# Patient Record
Sex: Male | Born: 1945 | Race: White | Hispanic: No | Smoking: Former smoker
Health system: Southern US, Community
[De-identification: ages and names within clinical notes are randomized; demographics above are authoritative.]

---

## 2003-12-10 ENCOUNTER — Encounter (INDEPENDENT_AMBULATORY_CARE_PROVIDER_SITE_OTHER): Payer: Self-pay | Admitting: Specialist

## 2003-12-10 ENCOUNTER — Ambulatory Visit (HOSPITAL_COMMUNITY): Admission: RE | Admit: 2003-12-10 | Discharge: 2003-12-10 | Payer: Self-pay | Admitting: Gastroenterology

## 2005-08-11 ENCOUNTER — Encounter: Admission: RE | Admit: 2005-08-11 | Discharge: 2005-08-11 | Payer: Self-pay | Admitting: Family Medicine

## 2005-08-21 ENCOUNTER — Encounter: Admission: RE | Admit: 2005-08-21 | Discharge: 2005-08-21 | Payer: Self-pay | Admitting: Family Medicine

## 2005-08-29 ENCOUNTER — Encounter: Admission: RE | Admit: 2005-08-29 | Discharge: 2005-08-29 | Payer: Self-pay | Admitting: Family Medicine

## 2006-01-05 ENCOUNTER — Encounter: Admission: RE | Admit: 2006-01-05 | Discharge: 2006-01-05 | Payer: Self-pay | Admitting: Family Medicine

## 2006-12-25 ENCOUNTER — Encounter: Admission: RE | Admit: 2006-12-25 | Discharge: 2006-12-25 | Payer: Self-pay | Admitting: Family Medicine

## 2008-09-02 IMAGING — CR DG RIBS W/ CHEST 3+V*R*
4 series · 4 of 4 positions shown · non-contrast
Comparison: none

CLINICAL DATA: Abnormal ribs on bone scan.

RIGHT RIBS - 4 VIEW total, including a single view of the chest

[w chest pa]
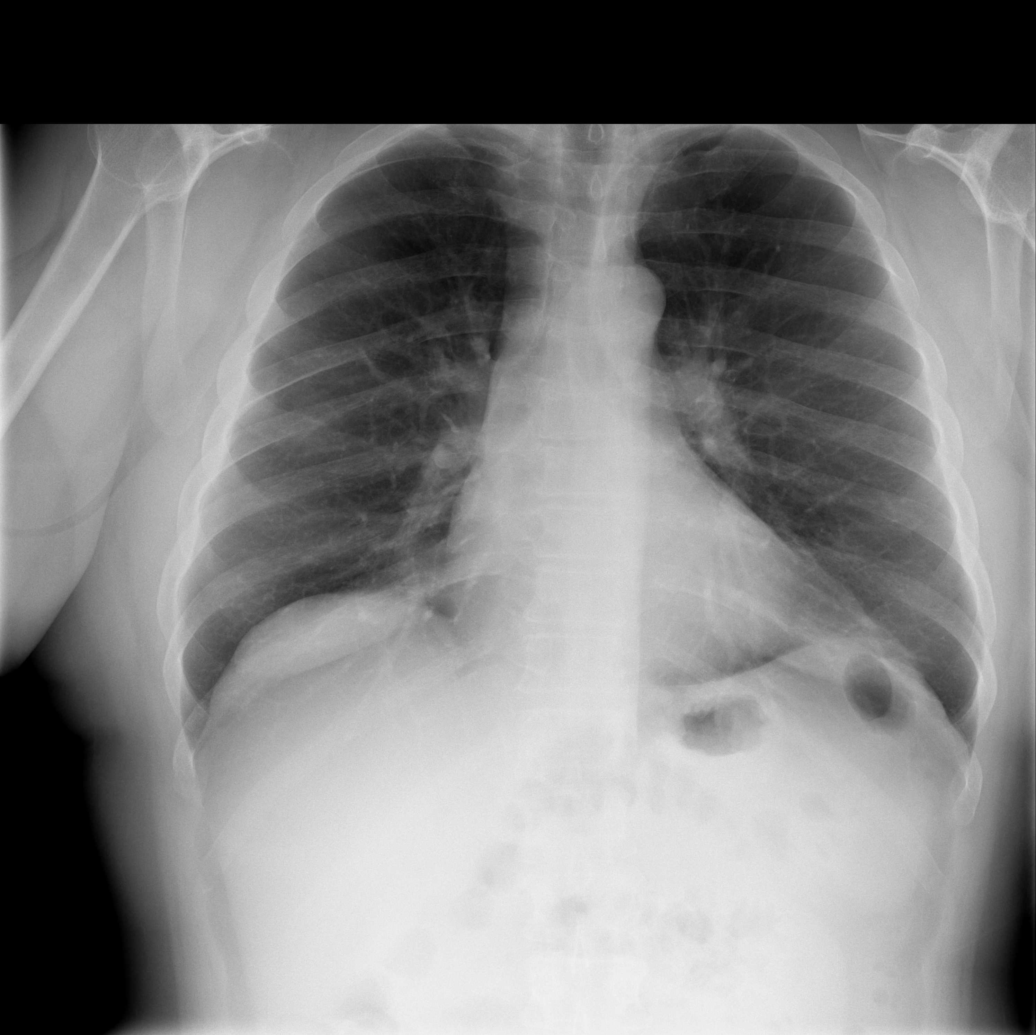

[t ribs ap/pa  lower right]
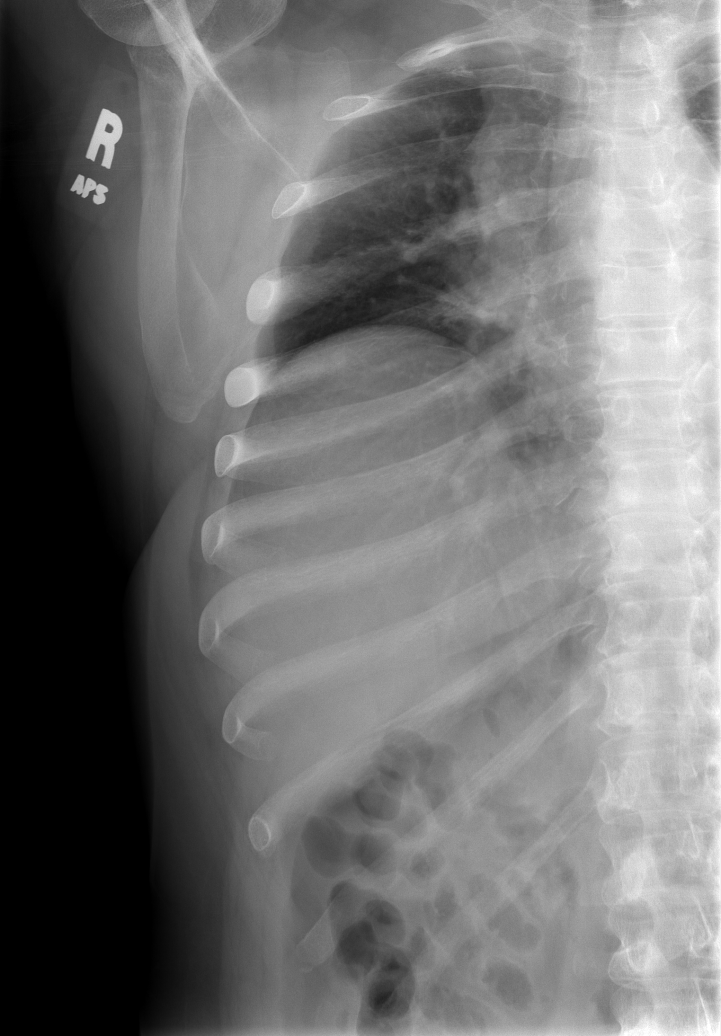

[t ribs obl. right (1 of 2)]
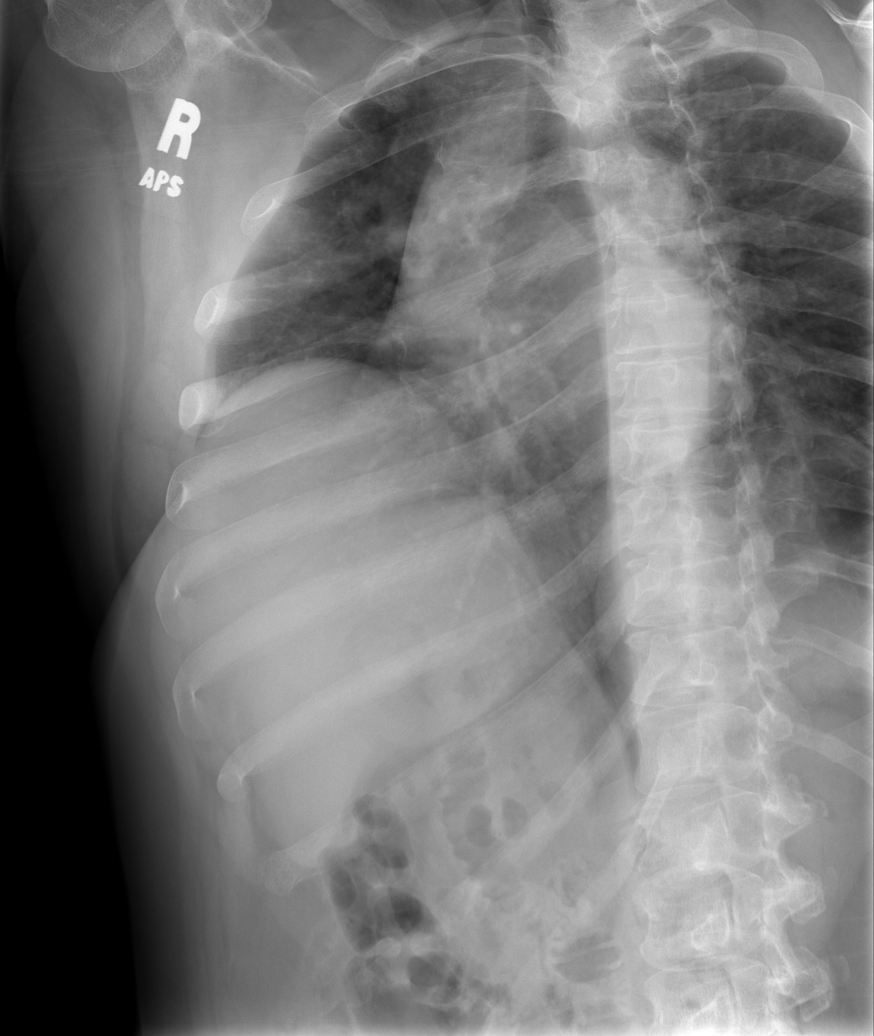

[t ribs obl. right (2 of 2)]
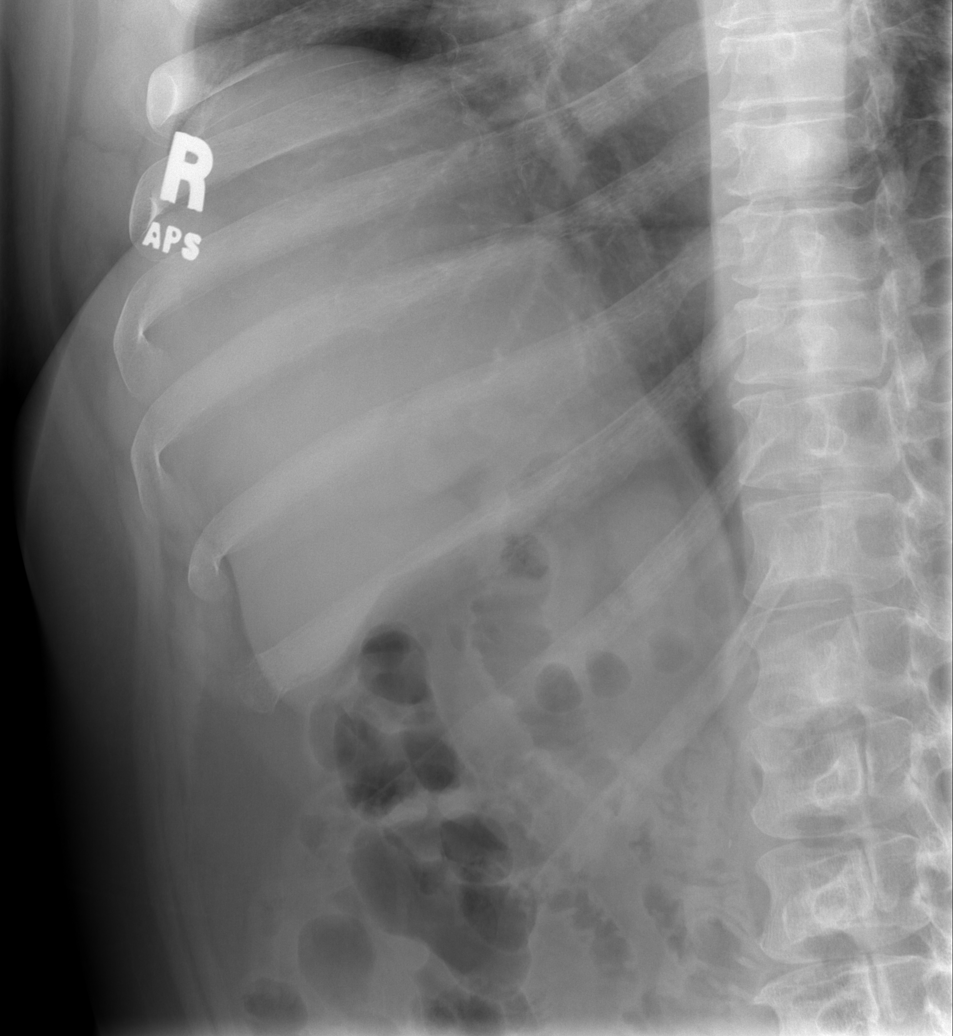

[4 of 4 positions shown; findings below may reference images not displayed]

FINDINGS: This faint sclerosis and expansion of the right fourth and fifth
ribs. Conventional radiography does not depict the involvement of the 8th 10th
and 12th ribs which is known to be present based on the prior bone scan. On
prior CT, these regions appear somewhat expansile and with groundglass matrix.

The lungs appear clear. The heart and mediastinum appear unremarkable.

IMPRESSION

1. Expansion and sclerosis in the lateral portions of the right fourth and fifth
ribs. Correlating with prior CT scan and bone scan, the appearance is suspicious
for fibrous dysplasia of the ribs as the underlying cause, given the groundglass
matrix and mildly expansile appearance on CT. This would be an unusual way for
metastatic recurrent testicular cancer to present.  Metastatic prostate cancer
can present with sclerotic bony lesions, and correlation with prostate physical
exam and PSA level is suggested. Followup imaging of these ribs may be warranted
to ensure stability.

## 2010-09-09 NOTE — Op Note (Signed)
NAME:  Timothy Johnson, Timothy Johnson                       ACCOUNT NO.:  [id number]   MEDICAL RECORD NO.:  [id number]                   PATIENT TYPE:  AMB   LOCATION:  ENDO                                 FACILITY:  [facility]   PHYSICIAN:  Graylin Shiver, M.D.                DATE OF BIRTH:  [birth year]   DATE OF PROCEDURE:  12/10/2003  DATE OF DISCHARGE:                                 OPERATIVE REPORT   PROCEDURE:  Colonoscopy with biopsy.   INDICATIONS FOR PROCEDURE:  Screening.  Informed consent was obtained after  explanation of the risks of bleeding, infection, and perforation.   PREMEDICATION:  Fentanyl 60 mcg IV, Versed 6 mg IV.   PROCEDURE IN DETAIL:  With the patient in the left lateral decubitus  position, a rectal exam was performed and no masses were felt.  The Olympus  colonoscope was inserted into the rectum and advanced around the colon to  the cecum.  Cecal landmarks were identified.  In the cecum, there was a  small 3 mm polyp biopsied with cold forceps.  The ascending colon looked  normal.  The transverse colon looked normal.  The descending colon and  sigmoid looked normal.  In the rectum, there was a small 3 mm polyp biopsied  with cold forceps.  He tolerated the procedure well without complications.   IMPRESSION:  1. Cecal polyp, code 211.3.  2. Rectal polyp, code 211.4.   PLAN:  The pathology will be checked.                                               Graylin Shiver, M.D.    [name]  D:  12/10/2003  T:  12/10/2003  Job:  [id number]

## 2014-05-20 ENCOUNTER — Other Ambulatory Visit: Payer: Self-pay | Admitting: Family Medicine

## 2014-05-20 DIAGNOSIS — R319 Hematuria, unspecified: Secondary | ICD-10-CM

## 2014-05-21 ENCOUNTER — Ambulatory Visit
Admission: RE | Admit: 2014-05-21 | Discharge: 2014-05-21 | Disposition: A | Discharge status: Home / Self Care | Payer: Medicare Other | Source: Ambulatory Visit | Attending: Family Medicine | Admitting: Family Medicine

## 2014-05-21 DIAGNOSIS — R319 Hematuria, unspecified: Secondary | ICD-10-CM

## 2015-05-31 DIAGNOSIS — R05 Cough: Secondary | ICD-10-CM | POA: Diagnosis not present

## 2015-05-31 DIAGNOSIS — J069 Acute upper respiratory infection, unspecified: Secondary | ICD-10-CM | POA: Diagnosis not present

## 2015-07-19 DIAGNOSIS — N400 Enlarged prostate without lower urinary tract symptoms: Secondary | ICD-10-CM | POA: Diagnosis not present

## 2015-07-19 DIAGNOSIS — R351 Nocturia: Secondary | ICD-10-CM | POA: Diagnosis not present

## 2015-07-19 DIAGNOSIS — Z0000 Encounter for general adult medical examination without abnormal findings: Secondary | ICD-10-CM | POA: Diagnosis not present

## 2015-09-21 DIAGNOSIS — F411 Generalized anxiety disorder: Secondary | ICD-10-CM | POA: Diagnosis not present

## 2015-09-21 DIAGNOSIS — E7800 Pure hypercholesterolemia, unspecified: Secondary | ICD-10-CM | POA: Diagnosis not present

## 2015-09-21 DIAGNOSIS — Z125 Encounter for screening for malignant neoplasm of prostate: Secondary | ICD-10-CM | POA: Diagnosis not present

## 2015-09-21 DIAGNOSIS — I10 Essential (primary) hypertension: Secondary | ICD-10-CM | POA: Diagnosis not present

## 2015-09-21 DIAGNOSIS — Z1389 Encounter for screening for other disorder: Secondary | ICD-10-CM | POA: Diagnosis not present

## 2015-09-21 DIAGNOSIS — Z0000 Encounter for general adult medical examination without abnormal findings: Secondary | ICD-10-CM | POA: Diagnosis not present

## 2015-09-21 DIAGNOSIS — N400 Enlarged prostate without lower urinary tract symptoms: Secondary | ICD-10-CM | POA: Diagnosis not present

## 2015-09-21 DIAGNOSIS — J45909 Unspecified asthma, uncomplicated: Secondary | ICD-10-CM | POA: Diagnosis not present

## 2015-09-21 DIAGNOSIS — Z23 Encounter for immunization: Secondary | ICD-10-CM | POA: Diagnosis not present

## 2016-03-23 DIAGNOSIS — N400 Enlarged prostate without lower urinary tract symptoms: Secondary | ICD-10-CM | POA: Diagnosis not present

## 2016-03-23 DIAGNOSIS — J45909 Unspecified asthma, uncomplicated: Secondary | ICD-10-CM | POA: Diagnosis not present

## 2016-03-23 DIAGNOSIS — F411 Generalized anxiety disorder: Secondary | ICD-10-CM | POA: Diagnosis not present

## 2016-03-23 DIAGNOSIS — E7800 Pure hypercholesterolemia, unspecified: Secondary | ICD-10-CM | POA: Diagnosis not present

## 2016-03-23 DIAGNOSIS — I10 Essential (primary) hypertension: Secondary | ICD-10-CM | POA: Diagnosis not present

## 2016-07-11 DIAGNOSIS — I10 Essential (primary) hypertension: Secondary | ICD-10-CM | POA: Diagnosis not present

## 2016-09-29 DIAGNOSIS — Z0000 Encounter for general adult medical examination without abnormal findings: Secondary | ICD-10-CM | POA: Diagnosis not present

## 2016-09-29 DIAGNOSIS — I10 Essential (primary) hypertension: Secondary | ICD-10-CM | POA: Diagnosis not present

## 2016-09-29 DIAGNOSIS — E7800 Pure hypercholesterolemia, unspecified: Secondary | ICD-10-CM | POA: Diagnosis not present

## 2016-09-29 DIAGNOSIS — Z1389 Encounter for screening for other disorder: Secondary | ICD-10-CM | POA: Diagnosis not present

## 2017-04-06 DIAGNOSIS — E7800 Pure hypercholesterolemia, unspecified: Secondary | ICD-10-CM | POA: Diagnosis not present

## 2017-04-06 DIAGNOSIS — F411 Generalized anxiety disorder: Secondary | ICD-10-CM | POA: Diagnosis not present

## 2017-04-06 DIAGNOSIS — I10 Essential (primary) hypertension: Secondary | ICD-10-CM | POA: Diagnosis not present

## 2017-04-06 DIAGNOSIS — N400 Enlarged prostate without lower urinary tract symptoms: Secondary | ICD-10-CM | POA: Diagnosis not present

## 2017-06-11 DIAGNOSIS — J45901 Unspecified asthma with (acute) exacerbation: Secondary | ICD-10-CM | POA: Diagnosis not present

## 2017-07-09 DIAGNOSIS — S0500XA Injury of conjunctiva and corneal abrasion without foreign body, unspecified eye, initial encounter: Secondary | ICD-10-CM | POA: Diagnosis not present

## 2017-07-10 DIAGNOSIS — H1031 Unspecified acute conjunctivitis, right eye: Secondary | ICD-10-CM | POA: Diagnosis not present

## 2017-10-01 ENCOUNTER — Ambulatory Visit
Admission: RE | Admit: 2017-10-01 | Discharge: 2017-10-01 | Disposition: A | Discharge status: Home / Self Care | Payer: Medicare Other | Source: Ambulatory Visit | Attending: Family Medicine | Admitting: Family Medicine

## 2017-10-01 ENCOUNTER — Other Ambulatory Visit: Payer: Self-pay | Admitting: Family Medicine

## 2017-10-01 DIAGNOSIS — I10 Essential (primary) hypertension: Secondary | ICD-10-CM | POA: Diagnosis not present

## 2017-10-01 DIAGNOSIS — R05 Cough: Secondary | ICD-10-CM

## 2017-10-01 DIAGNOSIS — R059 Cough, unspecified: Secondary | ICD-10-CM

## 2017-10-01 DIAGNOSIS — E7800 Pure hypercholesterolemia, unspecified: Secondary | ICD-10-CM | POA: Diagnosis not present

## 2017-10-01 DIAGNOSIS — Z0000 Encounter for general adult medical examination without abnormal findings: Secondary | ICD-10-CM | POA: Diagnosis not present

## 2017-10-01 DIAGNOSIS — N400 Enlarged prostate without lower urinary tract symptoms: Secondary | ICD-10-CM | POA: Diagnosis not present

## 2018-03-20 ENCOUNTER — Observation Stay (HOSPITAL_COMMUNITY)
Admission: EM | Admit: 2018-03-20 | Discharge: 2018-03-21 | Disposition: A | Discharge status: Home / Self Care | Payer: Medicare Other | Attending: Internal Medicine | Admitting: Internal Medicine

## 2018-03-20 ENCOUNTER — Encounter (HOSPITAL_COMMUNITY): Payer: Self-pay | Admitting: Emergency Medicine

## 2018-03-20 ENCOUNTER — Emergency Department (HOSPITAL_COMMUNITY): Payer: Medicare Other

## 2018-03-20 ENCOUNTER — Other Ambulatory Visit: Payer: Self-pay

## 2018-03-20 DIAGNOSIS — R42 Dizziness and giddiness: Secondary | ICD-10-CM | POA: Diagnosis not present

## 2018-03-20 DIAGNOSIS — R9431 Abnormal electrocardiogram [ECG] [EKG]: Secondary | ICD-10-CM | POA: Diagnosis not present

## 2018-03-20 DIAGNOSIS — R112 Nausea with vomiting, unspecified: Secondary | ICD-10-CM | POA: Diagnosis present

## 2018-03-20 DIAGNOSIS — E860 Dehydration: Secondary | ICD-10-CM | POA: Diagnosis present

## 2018-03-20 DIAGNOSIS — F419 Anxiety disorder, unspecified: Secondary | ICD-10-CM | POA: Insufficient documentation

## 2018-03-20 DIAGNOSIS — Z7951 Long term (current) use of inhaled steroids: Secondary | ICD-10-CM | POA: Diagnosis not present

## 2018-03-20 DIAGNOSIS — Z79899 Other long term (current) drug therapy: Secondary | ICD-10-CM | POA: Insufficient documentation

## 2018-03-20 DIAGNOSIS — I10 Essential (primary) hypertension: Secondary | ICD-10-CM | POA: Diagnosis not present

## 2018-03-20 DIAGNOSIS — I4581 Long QT syndrome: Secondary | ICD-10-CM | POA: Diagnosis not present

## 2018-03-20 LAB — COMPREHENSIVE METABOLIC PANEL
ALK PHOS: 61 U/L (ref 38–126)
ALT: 16 U/L (ref 0–44)
AST: 19 U/L (ref 15–41)
Albumin: 3.5 g/dL (ref 3.5–5.0)
Anion gap: 9 (ref 5–15)
BUN: 16 mg/dL (ref 8–23)
CALCIUM: 8.7 mg/dL — AB (ref 8.9–10.3)
CHLORIDE: 98 mmol/L (ref 98–111)
CO2: 27 mmol/L (ref 22–32)
CREATININE: 0.87 mg/dL (ref 0.61–1.24)
GFR calc non Af Amer: >60 mL/min (ref >60)
Glucose, Bld: 152 mg/dL — ABNORMAL HIGH (ref 70–99)
Potassium: 4.1 mmol/L (ref 3.5–5.1)
SODIUM: 134 mmol/L — AB (ref 135–145)
Total Bilirubin: 0.6 mg/dL (ref 0.3–1.2)
Total Protein: 6.6 g/dL (ref 6.5–8.1)

## 2018-03-20 LAB — CBC WITH DIFFERENTIAL/PLATELET
Abs Immature Granulocytes: 0.08 10*3/uL — ABNORMAL HIGH (ref 0.00–0.07)
BASOS ABS: 0.2 10*3/uL — AB (ref 0.0–0.1)
BASOS PCT: 2 %
EOS ABS: 0.2 10*3/uL (ref 0.0–0.5)
EOS PCT: 2 %
HCT: 43.9 % (ref 39.0–52.0)
HEMOGLOBIN: 14.1 g/dL (ref 13.0–17.0)
Immature Granulocytes: 1 %
Lymphocytes Relative: 11 %
Lymphs Abs: 1 10*3/uL (ref 0.7–4.0)
MCH: 29.1 pg (ref 26.0–34.0)
MCHC: 32.1 g/dL (ref 30.0–36.0)
MCV: 90.5 fL (ref 80.0–100.0)
Monocytes Absolute: 0.5 10*3/uL (ref 0.1–1.0)
Monocytes Relative: 5 %
NRBC: 0 % (ref 0.0–0.2)
Neutro Abs: 7.6 10*3/uL (ref 1.7–7.7)
Neutrophils Relative %: 79 %
Platelets: 295 10*3/uL (ref 150–400)
RBC: 4.85 MIL/uL (ref 4.22–5.81)
RDW: 12.6 % (ref 11.5–15.5)
WBC: 9.6 10*3/uL (ref 4.0–10.5)

## 2018-03-20 LAB — MAGNESIUM: Magnesium: 2 mg/dL (ref 1.7–2.4)

## 2018-03-20 LAB — I-STAT TROPONIN, ED: Troponin i, poc: 0 ng/mL (ref 0.00–0.08)

## 2018-03-20 MED ORDER — MECLIZINE HCL 12.5 MG PO TABS
12.5000 mg | ORAL_TABLET | Freq: Two times a day (BID) | ORAL | Status: DC | PRN
  Filled 2018-03-20: qty 1

## 2018-03-20 MED ORDER — MECLIZINE HCL 12.5 MG PO TABS: 12.5000 mg | ORAL_TABLET | Freq: Two times a day (BID) | ORAL | 0 refills | Status: DC | PRN

## 2018-03-20 MED ORDER — SODIUM CHLORIDE 0.9 % IV BOLUS
500.0000 mL | Freq: Once | INTRAVENOUS | Status: AC
  Administered 2018-03-20: 500 mL via INTRAVENOUS

## 2018-03-20 MED ORDER — HYDROCODONE-ACETAMINOPHEN 5-325 MG PO TABS
1.0000 | ORAL_TABLET | ORAL | Status: DC | PRN
  Administered 2018-03-21: 1 via ORAL
  Filled 2018-03-20: qty 1

## 2018-03-20 MED ORDER — LORAZEPAM 2 MG/ML IJ SOLN
0.5000 mg | Freq: Once | INTRAMUSCULAR | Status: AC
  Administered 2018-03-20: 0.5 mg via INTRAVENOUS
  Filled 2018-03-20: qty 1

## 2018-03-20 MED ORDER — LORAZEPAM 0.5 MG PO TABS: 0.5000 mg | ORAL_TABLET | Freq: Two times a day (BID) | ORAL | 0 refills | Status: DC | PRN

## 2018-03-20 MED ORDER — ACETAMINOPHEN 650 MG RE SUPP: 650.0000 mg | Freq: Four times a day (QID) | RECTAL | Status: DC | PRN

## 2018-03-20 MED ORDER — ACETAMINOPHEN 325 MG PO TABS
650.0000 mg | ORAL_TABLET | Freq: Four times a day (QID) | ORAL | Status: DC | PRN
  Administered 2018-03-21: 650 mg via ORAL
  Filled 2018-03-20: qty 2

## 2018-03-20 MED ORDER — SODIUM CHLORIDE 0.9 % IV SOLN
INTRAVENOUS | Status: AC
  Administered 2018-03-20: 22:00:00 via INTRAVENOUS

## 2018-03-20 MED ORDER — LOSARTAN POTASSIUM 50 MG PO TABS
100.0000 mg | ORAL_TABLET | Freq: Every day | ORAL | Status: DC
  Administered 2018-03-21: 100 mg via ORAL
  Filled 2018-03-20: qty 2

## 2018-03-20 MED ORDER — LORAZEPAM 2 MG/ML IJ SOLN: 0.5000 mg | Freq: Four times a day (QID) | INTRAMUSCULAR | Status: DC | PRN

## 2018-03-20 MED ORDER — MOMETASONE FURO-FORMOTEROL FUM 200-5 MCG/ACT IN AERO
2.0000 | INHALATION_SPRAY | Freq: Two times a day (BID) | RESPIRATORY_TRACT | Status: DC
  Administered 2018-03-20 – 2018-03-21 (×2): 2 via RESPIRATORY_TRACT
  Filled 2018-03-20: qty 8.8

## 2018-03-20 MED ORDER — MECLIZINE HCL 25 MG PO TABS
12.5000 mg | ORAL_TABLET | Freq: Once | ORAL | Status: AC
  Administered 2018-03-20: 12.5 mg via ORAL
  Filled 2018-03-20: qty 1

## 2018-03-20 NOTE — ED Notes (Signed)
Pts son called to come pick up pt for discharge

## 2018-03-20 NOTE — ED Notes (Signed)
Pt back from MRI 

## 2018-03-20 NOTE — Discharge Instructions (Addendum)
Dizziness Dizziness is a common problem. It makes you feel unsteady or light-headed. You may feel like you are about to pass out (faint). Dizziness can lead to getting hurt if you stumble or fall. Dizziness can be caused by many things, including:  Medicines.  Not having enough water in your body (dehydration).  Illness.  Follow these instructions at home: Eating and drinking  Drink enough fluid to keep your pee (urine) clear or pale yellow. This helps to keep you from getting dehydrated. Try to drink more clear fluids, such as water.  Do not drink alcohol.  Limit how much caffeine you drink or eat, if your doctor tells you to do that.  Limit how much salt (sodium) you drink or eat, if your doctor tells you to do that. Activity  Avoid making quick movements. ? When you stand up from sitting in a chair, steady yourself until you feel okay. ? In the morning, first sit up on the side of the bed. When you feel okay, stand slowly while you hold onto something. Do this until you know that your balance is fine.  If you need to stand in one place for a long time, move your legs often. Tighten and relax the muscles in your legs while you are standing.  Do not drive or use heavy machinery if you feel dizzy.  Avoid bending down if you feel dizzy. Place items in your home so you can reach them easily without leaning over. Lifestyle  Do not use any products that contain nicotine or tobacco, such as cigarettes and e-cigarettes. If you need help quitting, ask your doctor.  Try to lower your stress level. You can do this by using methods such as yoga or meditation. Talk with your doctor if you need help. General instructions  Watch your dizziness for any changes.  Take over-the-counter and prescription medicines only as told by your doctor. Talk with your doctor if you think that you are dizzy because of a medicine that you are taking.  Tell a friend or a family member that you are feeling  dizzy. If he or she notices any changes in your behavior, have this person call your doctor.  Keep all follow-up visits as told by your doctor. This is important. Contact a doctor if:  Your dizziness does not go away.  Your dizziness or light-headedness gets worse.  You feel sick to your stomach (nauseous).  You have trouble hearing.  You have new symptoms.  You are unsteady on your feet.  You feel like the room is spinning. Get help right away if:  You throw up (vomit) or have watery poop (diarrhea), and you cannot eat or drink anything.  You have trouble: ? Talking. ? Walking. ? Swallowing. ? Using your arms, hands, or legs.  You feel generally weak.  You are not thinking clearly, or you have trouble forming sentences. A friend or family member may notice this.  You have: ? Chest pain. ? Pain in your belly (abdomen). ? Shortness of breath. ? Sweating.  Your vision changes.  You are bleeding.  You have a very bad headache.  You have neck pain or a stiff neck.  You have a fever. These symptoms may be an emergency. Do not wait to see if the symptoms will go away. Get medical help right away. Call your local emergency services (911 in the [place] Do not drive yourself to the hospital. Summary  Dizziness makes you feel unsteady or light-headed. You may  feel like you are about to pass out (faint).  Drink enough fluid to keep your pee (urine) clear or pale yellow. Do not drink alcohol.  Avoid making quick movements if you feel dizzy.  Watch your dizziness for any changes. This information is not intended to replace advice given to you by your health care provider. Make sure you discuss any questions you have with your health care provider. Document Released: 03/30/2011 Document Revised: 04/27/2016 Document Reviewed: 04/27/2016 Elsevier Interactive Patient Education  2017 [employer] Take meclizine as needed for dizziness or nausea.  Use ativan as needed for  nausea or anxiety.  Follow up with your primary care doctor on Monday for further evaluation of your symptoms.  Return to the ER with any new, worsening, or concerning symptoms.

## 2018-03-20 NOTE — ED Triage Notes (Signed)
Pt brought in by [facility] from home. Pt recently has had lots of stress in his life. Pts wife recently passes away. Pt states this morning he started feeling dizzy with N/V. EMS gave 4mg  Zofran. No pain  BP 158/108 HR 69

## 2018-03-20 NOTE — ED Notes (Signed)
Son, Marcelle Smilingsamuel Metheney jr., would like an update on father at [phone]

## 2018-03-20 NOTE — ED Provider Notes (Signed)
[facility] [address] OBSERVATION UNIT Provider Note   CSN: [id number] Arrival date & time: 03/20/18  1345     History   Chief Complaint No chief complaint on file.   HPI Timothy Johnson is a 72 y.o. male presenting for evaluation of dizziness.  Patient states that he ate breakfast, he had acute onset of dizziness.  He felt like the room was spinning.  He had associated nausea, but no vomiting.  He denies history of similar.  He states it was difficult to walk due to the dizziness.  He tried to give it time to pass, but symptoms persisted for several hours.  His are worse with movement of his head, and change in position.  Mild at rest.  He has not taken anything for his symptoms.  He denies recent fall, trauma, or injury.  He is not on blood thinners.  He denies headache, vision changes, slurred speech, numbness/tingling, weakness, chest pain, shortness of breath, cough, abdominal pain, urinary symptoms, normal bowel movements.  Patient has a history of anxiety for which he takes citalopram, states that his GAD is hard to control.  Reports several social stressors recently, including the death of his wife several weeks ago and discord between him and his children.   HPI  History reviewed. No pertinent past medical history.  Patient Active Problem List   Diagnosis Date Noted  . Vertigo 03/20/2018  . Prolonged QT interval 03/20/2018  . Dehydration 03/20/2018  . Nausea and vomiting 03/20/2018    History reviewed. No pertinent surgical history.      Home Medications    Prior to Admission medications   Medication Sig Start Date End Date Taking? Authorizing Provider  albuterol (PROVENTIL HFA;VENTOLIN HFA) 108 (90 Base) MCG/ACT inhaler Inhale 2 puffs into the lungs every 6 (six) hours as needed for wheezing or shortness of breath.   Yes [provider]  budesonide-formoterol (SYMBICORT) 160-4.5 MCG/ACT inhaler Inhale 2 puffs into the lungs 2 (two) times daily.   Yes  [provider]  escitalopram (LEXAPRO) 10 MG tablet Take 10 mg by mouth daily. 12/12/17  Yes [provider]  hydrochlorothiazide (HYDRODIURIL) 25 MG tablet Take 25 mg by mouth daily.   Yes [provider]  losartan (COZAAR) 100 MG tablet Take 100 mg by mouth daily.   Yes [provider]    Family History Family History  Problem Relation Age of Onset  . Hypertension Other   . Stroke Neg Hx   . Diabetes Neg Hx     Social History Social History   Tobacco Use  . Smoking status: Former Games developermoker  . Smokeless tobacco: Never Used  Substance Use Topics  . Alcohol use: Yes  . Drug use: Not on file     Allergies   Patient has no allergy information on record.   Review of Systems Review of Systems  Gastrointestinal: Positive for nausea.  Neurological: Positive for dizziness.  All other systems reviewed and are negative.    Physical Exam Updated Vital Signs BP (!) 153/77 (BP Location: Right Arm)   Pulse 78   Temp (!) 97.3 F (36.3 C) (Oral)   Resp 18   Ht 5\' 3"  (1.6 m)   Wt 94.3 kg   SpO2 95%   BMI 36.85 kg/m   Physical Exam  Constitutional: He is oriented to person, place, and time. He appears well-developed and well-nourished. No distress.  Elderly male in no acute distress  HENT:  Head: Normocephalic and atraumatic.  Eyes: Pupils are equal, round, and reactive to light. Conjunctivae and EOM are normal.  EOMI and PERRLA.  No nystagmus.  Neck: Normal range of motion. Neck supple.  Cardiovascular: Normal rate, regular rhythm and intact distal pulses.  Pulmonary/Chest: Effort normal and breath sounds normal. No respiratory distress. He has no wheezes.  Abdominal: Soft. He exhibits no distension and no mass. There is no tenderness. There is no guarding.  Musculoskeletal: Normal range of motion. He exhibits no edema or tenderness.  Strength intact x4.  Sensation intact x4.  Radial pedal pulses intact bilaterally.  Soft compartments.    Neurological: He is alert and oriented to person, place, and time. No cranial nerve deficit or sensory deficit.  No obvious neurologic deficits.  Fine movement coordination intact.  Grip strength intact.  CN intact.  Nose to finger intact.  Skin: Skin is warm and dry. Capillary refill takes less than 2 seconds.  Psychiatric: He has a normal mood and affect.  Nursing note and vitals reviewed.    ED Treatments / Results  Labs (all labs ordered are listed, but only abnormal results are displayed) Labs Reviewed  CBC WITH DIFFERENTIAL/PLATELET - Abnormal; Notable for the following components:      Result Value   Basophils Absolute 0.2 (*)    Abs Immature Granulocytes 0.08 (*)    All other components within normal limits  COMPREHENSIVE METABOLIC PANEL - Abnormal; Notable for the following components:   Sodium 134 (*)    Glucose, Bld 152 (*)    Calcium 8.7 (*)    All other components within normal limits  MAGNESIUM  MAGNESIUM  PHOSPHORUS  TSH  COMPREHENSIVE METABOLIC PANEL  CBC  I-STAT TROPONIN, ED    EKG EKG Interpretation  Date/Time:  Wednesday March 20 2018 13:50:44 EST Ventricular Rate:  66 PR Interval:    QRS Duration: 99 QT Interval:  608 QTC Calculation: 638 R Axis:   -8 Text Interpretation:  Sinus rhythm Low voltage, precordial leads Borderline T abnormalities, anterior leads Prolonged QT interval No old tracing to compare Confirmed by Mancel Bale [phone]) on 03/20/2018 2:48:25 PM   Radiology Mr Brain Wo Contrast  Result Date: 03/20/2018 CLINICAL DATA:  72 year old male with extreme dizziness and nausea since 0800 hours today. EXAM: MRI HEAD WITHOUT CONTRAST TECHNIQUE: Multiplanar, multiecho pulse sequences of the brain and surrounding structures were obtained without intravenous contrast. COMPARISON:  None. FINDINGS: Brain: No restricted diffusion to suggest acute infarction. No midline shift, mass effect, evidence of mass lesion, ventriculomegaly, extra-axial  collection or acute intracranial hemorrhage. Cervicomedullary junction and pituitary are within normal limits. Mild to moderate for age scattered cerebral white matter T2 and FLAIR hyperintensity, mostly small but occasionally patchy foci. No cortical encephalomalacia or chronic cerebral blood products identified. Deep gray matter nuclei, the brainstem, and cerebellum appear normal. Vascular: Major intracranial vascular flow voids are preserved. Mild intracranial artery tortuosity. Skull and upper cervical spine: Negative visible cervical spine. Normal bone marrow signal. Sinuses/Orbits: Negative orbits. Trace paranasal sinus mucosal thickening. Other: Visible internal auditory structures appear normal. Mastoids are clear. Normal stylomastoid foramina. Scalp and face soft tissues appear negative. IMPRESSION: No acute intracranial abnormality and largely unremarkable for age noncontrast MRI appearance of the brain. Mild nonspecific cerebral white matter signal changes, most commonly due to chronic small vessel disease. Electronically Signed   By: Odessa Fleming M.D.   On: 03/20/2018 17:26    Procedures Procedures (including critical care time)  Medications Ordered in ED Medications  LORazepam (ATIVAN) injection  0.5 mg (has no administration in time range)  losartan (COZAAR) tablet 100 mg (has no administration in time range)  mometasone-formoterol (DULERA) 200-5 MCG/ACT inhaler 2 puff (has no administration in time range)  meclizine (ANTIVERT) tablet 12.5 mg (has no administration in time range)  acetaminophen (TYLENOL) tablet 650 mg (has no administration in time range)    Or  acetaminophen (TYLENOL) suppository 650 mg (has no administration in time range)  HYDROcodone-acetaminophen (NORCO/VICODIN) 5-325 MG per tablet 1-2 tablet (has no administration in time range)  0.9 %  sodium chloride infusion (has no administration in time range)  sodium chloride 0.9 % bolus 500 mL (0 mLs Intravenous Stopped 03/20/18  1619)  LORazepam (ATIVAN) injection 0.5 mg (0.5 mg Intravenous Given 03/20/18 1523)  meclizine (ANTIVERT) tablet 12.5 mg (12.5 mg Oral Given 03/20/18 1803)     Initial Impression / Assessment and Plan / ED Course  I have reviewed the triage vital signs and the nursing notes.  Pertinent labs & imaging results that were available during my care of the patient were reviewed by me and considered in my medical decision making (see chart for details).     Pt presenting for evaluation of dizziness and nausea.  Initial exam reassuring, no obvious neurologic deficits.  Consider stress versus vertigo versus BPPV.  Will obtain basic labs.  MRI ordered due to difficulty ambulating to rule out CVA.  Gentle fluids given.  EKG shows prolonged QTC.  Patient already received Zofran with EMS.  Concern about worsening QTC status.  Ativan given for nausea and dizziness.  On reassessment, patient report symptoms are slightly improved with Ativan.  Labs reassuring, no leukocytosis.  Electrolytes stable.  Troponin negative.  MRI pending.  MRI without acute findings.  On reassessment, patient reports continued mild dizziness.  Will give small dose of meclizine and reassess.  Case discussed with attending, Dr. Ranae Palms evaluated the patient.  On reassessment after meclizine, patient is unable to ambulate without risk of falling due to dizziness.  I am concerned about patient's safety if he goes home, as he lives at home by himself.  As such, will call for admission.  Discussed with Dr. Adela Glimpse from [facility], patient to be admitted.  Final Clinical Impressions(s) / ED Diagnoses   Final diagnoses:  Dizziness  Prolonged Q-T interval on ECG    ED Discharge Orders         Ordered    meclizine (ANTIVERT) 12.5 MG tablet  2 times daily PRN,   Status:  Discontinued     03/20/18 1842    LORazepam (ATIVAN) 0.5 MG tablet  2 times daily PRN,   Status:  Discontinued     03/20/18 1842             Analysa Nutting, PA-C 03/20/18 2156    Loren Racer, MD 03/20/18 2309

## 2018-03-20 NOTE — ED Notes (Signed)
Patient transported to CT 

## 2018-03-20 NOTE — ED Notes (Signed)
Patient transported to MRI 

## 2018-03-20 NOTE — H&P (Signed)
Timothy Johnson ZOX:[id number] DOB: 05/16/1945 DOA: 03/20/2018     PCP: Blair Heys, MD   Outpatient Specialists:  NONE   Patient arrived to ER on 03/20/18 at 1345  Patient coming from: home Lives alone     Chief Complaint: No chief complaint on file.   HPI: Timothy Johnson is a 72 y.o. male with medical history significant of anxiety, HTN      Presented with nausea vomiting lightheadedness since this morning  At 8 am after he ate breakfast sudden onset of vertigo room spinning nausea and vomiting no associated hearing loss.  No prior cold symptoms.  Endorses significant stressors lately Recently his wife died also have had a lot of stress visitation with his family no prior history of vertigo no prior history of stroke denies any slurred speech no localized neurological deficits no associated headache chest pain or shortness of breath no fevers or chills  Regarding pertinent Chronic problems: History of hypertension on multiple home medications   While in ER: ECG prolonged QT Meclizine tried still symptomatic  Started on IV fluid in ER  MRI of the head unremarkable The following Work up has been ordered so far:  Orders Placed This Encounter  Procedures  . MR BRAIN WO CONTRAST  . CBC with Differential  . Comprehensive metabolic panel  . Magnesium  . Vital signs  . Consult to hospitalist  . I-Stat Troponin, ED (not at [facility])  . EKG 12-Lead      Following Medications were ordered in ER: Medications  sodium chloride 0.9 % bolus 500 mL (0 mLs Intravenous Stopped 03/20/18 1619)  LORazepam (ATIVAN) injection 0.5 mg (0.5 mg Intravenous Given 03/20/18 1523)  meclizine (ANTIVERT) tablet 12.5 mg (12.5 mg Oral Given 03/20/18 1803)    Significant initial  Findings: Abnormal Labs Reviewed  CBC WITH DIFFERENTIAL/PLATELET - Abnormal; Notable for the following components:      Result Value   Basophils Absolute 0.2 (*)    Abs Immature Granulocytes 0.08 (*)    All  other components within normal limits  COMPREHENSIVE METABOLIC PANEL - Abnormal; Notable for the following components:   Sodium 134 (*)    Glucose, Bld 152 (*)    Calcium 8.7 (*)    All other components within normal limits     Lactic Acid, Venous No results found for: LATICACIDVEN  Na 134 K 4.1  Cr  stable,    Lab Results  Component Value Date   CREATININE 0.87 03/20/2018    trop 0.00  WBC  9.6  HG/HCT  stable,      Component Value Date/Time   HGB 14.1 03/20/2018 1422   HCT 43.9 03/20/2018 1422     Troponin (Point of Care Test) Recent Labs    03/20/18 1440  TROPIPOC 0.00      BNP (last 3 results) No results for input(s): BNP in the last 8760 hours.  ProBNP (last 3 results) No results for input(s): PROBNP in the last 8760 hours.  mg 2.0    UA not ordered   MRi head non acute  ECG:  Personally reviewed by me showing: HR : 66 Rhythm:  NSR  no evidence of ischemic changes [id number]     ED Triage Vitals  Enc Vitals Group     BP 03/20/18 1408 (!) 145/66     Pulse Rate 03/20/18 1408 65     Resp 03/20/18 1408 16     Temp --      Temp src --  SpO2 03/20/18 1408 100 %     Weight 03/20/18 1347 208 lb (94.3 kg)     Height 03/20/18 1347 5\' 3"  (1.6 m)     Head Circumference --      Peak Flow --      Pain Score 03/20/18 1347 10     Pain Loc --      Pain Edu? --      Excl. in GC? --   TMAX(24)@       Latest  Blood pressure (!) 137/59, pulse 65, resp. rate 16, height 5\' 3"  (1.6 m), weight 94.3 kg, SpO2 94 %.    Hospitalist was called for admission for persistent vertigo nausea vomiting inability to tolerate ambulation   Review of Systems:    Pertinent positives include: Vertigo, dizziness, nausea, vomiting  Constitutional:  No weight loss, night sweats, Fevers, chills, fatigue, weight loss  HEENT:  No headaches, Difficulty swallowing,Tooth/dental problems,Sore throat,  No sneezing, itching, ear ache, nasal congestion, post nasal drip,    Cardio-vascular:  No chest pain, Orthopnea, PND, anasarca,  palpitations.no Bilateral lower extremity swelling  GI:  No heartburn, indigestion, abdominal pain,, diarrhea, change in bowel habits, loss of appetite, melena, blood in stool, hematemesis Resp:  no shortness of breath at rest. No dyspnea on exertion, No excess mucus, no productive cough, No non-productive cough, No coughing up of blood.No change in color of mucus.No wheezing. Skin:  no rash or lesions. No jaundice GU:  no dysuria, change in color of urine, no urgency or frequency. No straining to urinate.  No flank pain.  Musculoskeletal:  No joint pain or no joint swelling. No decreased range of motion. No back pain.  Psych:  No change in mood or affect. No depression or anxiety. No memory loss.  Neuro: no localizing neurological complaints, no tingling, no weakness, no double vision, no gait abnormality, no slurred speech, no confusion  All systems reviewed and apart from HOPI all are negative  Past Medical History:  History reviewed. No pertinent past medical history.    History reviewed. No pertinent surgical history.  Social History:  Ambulatory   Independently     reports that he drinks alcohol. His tobacco and drug histories are not on file.     Family History:   Family History  Problem Relation Age of Onset  . Hypertension Other   . Stroke Neg Hx   . Diabetes Neg Hx     Allergies: Not on File   Prior to Admission medications   Not on File   Physical Exam: Blood pressure (!) 137/59, pulse 65, resp. rate 16, height 5\' 3"  (1.6 m), weight 94.3 kg, SpO2 94 %. 1. General:  in No Acute distress unable to stand up secondary to significant vertigo  acutely ill -appearing 2. Psychological: Alert and  Oriented 3. Head/ENT:    Dry Mucous Membranes                          Head Non traumatic, neck supple                          Poor Dentition 4. SKIN:   decreased Skin turgor,  Skin clean Dry and intact  no rash 5. Heart: Regular rate and rhythm no  Murmur, no Rub or gallop 6. Lungs:  o wheezes or crackles   7. Abdomen: Soft non-tender, Non distended  obese  bowel sounds present 8.  Lower extremities: no clubbing, cyanosis, or edema 9. Neurologically strength 5 out of 5 in all 4 extremities cranial nerves II through XII intact persistent lateral vertigo noted 10. MSK: Normal range of motion   LABS:     Recent Labs  Lab 03/20/18 1422  WBC 9.6  NEUTROABS 7.6  HGB 14.1  HCT 43.9  MCV 90.5  PLT 295   Basic Metabolic Panel: Recent Labs  Lab 03/20/18 1422  NA 134*  K 4.1  CL 98  CO2 27  GLUCOSE 152*  BUN 16  CREATININE 0.87  CALCIUM 8.7*  MG 2.0      Recent Labs  Lab 03/20/18 1422  AST 19  ALT 16  ALKPHOS 61  BILITOT 0.6  PROT 6.6  ALBUMIN 3.5   No results for input(s): LIPASE, AMYLASE in the last 168 hours. No results for input(s): AMMONIA in the last 168 hours.    HbA1C: No results for input(s): HGBA1C in the last 72 hours. CBG: No results for input(s): GLUCAP in the last 168 hours.    Urine analysis: No results found for: COLORURINE, APPEARANCEUR, LABSPEC, PHURINE, GLUCOSEU, HGBUR, BILIRUBINUR, KETONESUR, PROTEINUR, UROBILINOGEN, NITRITE, LEUKOCYTESUR     Cultures: No results found for: SDES, SPECREQUEST, CULT, REPTSTATUS   Radiological Exams on Admission: Mr Brain Wo Contrast  Result Date: 03/20/2018 CLINICAL DATA:  72 year old male with extreme dizziness and nausea since 0800 hours today. EXAM: MRI HEAD WITHOUT CONTRAST TECHNIQUE: Multiplanar, multiecho pulse sequences of the brain and surrounding structures were obtained without intravenous contrast. COMPARISON:  None. FINDINGS: Brain: No restricted diffusion to suggest acute infarction. No midline shift, mass effect, evidence of mass lesion, ventriculomegaly, extra-axial collection or acute intracranial hemorrhage. Cervicomedullary junction and pituitary are within normal limits. Mild to moderate  for age scattered cerebral white matter T2 and FLAIR hyperintensity, mostly small but occasionally patchy foci. No cortical encephalomalacia or chronic cerebral blood products identified. Deep gray matter nuclei, the brainstem, and cerebellum appear normal. Vascular: Major intracranial vascular flow voids are preserved. Mild intracranial artery tortuosity. Skull and upper cervical spine: Negative visible cervical spine. Normal bone marrow signal. Sinuses/Orbits: Negative orbits. Trace paranasal sinus mucosal thickening. Other: Visible internal auditory structures appear normal. Mastoids are clear. Normal stylomastoid foramina. Scalp and face soft tissues appear negative. IMPRESSION: No acute intracranial abnormality and largely unremarkable for age noncontrast MRI appearance of the brain. Mild nonspecific cerebral white matter signal changes, most commonly due to chronic small vessel disease. Electronically Signed   By: Odessa FlemingH  Hall M.D.   On: 03/20/2018 17:26    Chart has been reviewed    Assessment/Plan   72 y.o. male with medical history significant of anxiety, HTN   Admitted for persistent Vertigo and neg MRI  Present on Admission:  VERTIGO -rehydrate supportive management PT OT evaluation, negative MRI . Prolonged QT interval - - will monitor on tele avoid QT prolonging medications, rehydrate correct electrolytes  . Dehydration rehydrate with IV fluids and monitor fluid status . Nausea and vomiting -supportive management avoid QT prolonging medications use Ativan when possible Other plan as per orders.  DVT prophylaxis:  SCD   Code Status:  FULL CODE  as per patient I had personally discussed CODE STATUS with patient   Family Communication:   Family not at  Bedside    Disposition Plan:      To home once workup is complete and patient is stable  Would benefit from PT/OT eval prior to DC  Ordered                      Consults called: none  Admission status:   Obs      Level of care     tele  For 12H        Timothy Johnson 03/20/2018, 9:12 PM    [facility]  Pager [phone]   after 2 AM please page floor coverage PA If 7AM-7PM, please contact the day team taking care of the patient  [email or web address]  Password [email or web address]

## 2018-03-21 DIAGNOSIS — R42 Dizziness and giddiness: Secondary | ICD-10-CM | POA: Diagnosis not present

## 2018-03-21 DIAGNOSIS — E860 Dehydration: Secondary | ICD-10-CM | POA: Diagnosis not present

## 2018-03-21 DIAGNOSIS — R9431 Abnormal electrocardiogram [ECG] [EKG]: Secondary | ICD-10-CM | POA: Diagnosis not present

## 2018-03-21 DIAGNOSIS — R112 Nausea with vomiting, unspecified: Secondary | ICD-10-CM | POA: Diagnosis not present

## 2018-03-21 LAB — COMPREHENSIVE METABOLIC PANEL
ALT: 13 U/L (ref 0–44)
ANION GAP: 5 (ref 5–15)
AST: 20 U/L (ref 15–41)
Albumin: 3.1 g/dL — ABNORMAL LOW (ref 3.5–5.0)
Alkaline Phosphatase: 55 U/L (ref 38–126)
BUN: 10 mg/dL (ref 8–23)
CO2: 31 mmol/L (ref 22–32)
CREATININE: 0.98 mg/dL (ref 0.61–1.24)
Calcium: 8.4 mg/dL — ABNORMAL LOW (ref 8.9–10.3)
Chloride: 104 mmol/L (ref 98–111)
Glucose, Bld: 100 mg/dL — ABNORMAL HIGH (ref 70–99)
Potassium: 4.2 mmol/L (ref 3.5–5.1)
Sodium: 140 mmol/L (ref 135–145)
Total Bilirubin: 0.7 mg/dL (ref 0.3–1.2)
Total Protein: 6 g/dL — ABNORMAL LOW (ref 6.5–8.1)

## 2018-03-21 LAB — CBC
HEMATOCRIT: 43.9 % (ref 39.0–52.0)
HEMOGLOBIN: 14.3 g/dL (ref 13.0–17.0)
MCH: 29.1 pg (ref 26.0–34.0)
MCHC: 32.6 g/dL (ref 30.0–36.0)
MCV: 89.4 fL (ref 80.0–100.0)
Platelets: 277 10*3/uL (ref 150–400)
RBC: 4.91 MIL/uL (ref 4.22–5.81)
RDW: 12.8 % (ref 11.5–15.5)
WBC: 12.7 10*3/uL — ABNORMAL HIGH (ref 4.0–10.5)
nRBC: 0 % (ref 0.0–0.2)

## 2018-03-21 LAB — MAGNESIUM: MAGNESIUM: 2.3 mg/dL (ref 1.7–2.4)

## 2018-03-21 LAB — TSH: TSH: 5.491 u[IU]/mL — AB (ref 0.350–4.500)

## 2018-03-21 MED ORDER — SODIUM CHLORIDE 0.9 % IV BOLUS
1000.0000 mL | Freq: Once | INTRAVENOUS | Status: AC
  Administered 2018-03-21: 1000 mL via INTRAVENOUS

## 2018-03-21 MED ORDER — MECLIZINE HCL 12.5 MG PO TABS
12.5000 mg | ORAL_TABLET | Freq: Three times a day (TID) | ORAL | Status: DC
  Administered 2018-03-21: 12.5 mg via ORAL
  Filled 2018-03-21 (×3): qty 1

## 2018-03-21 MED ORDER — MECLIZINE HCL 12.5 MG PO TABS: 12.5000 mg | ORAL_TABLET | Freq: Three times a day (TID) | ORAL | 0 refills | Status: DC | PRN

## 2018-03-21 MED ORDER — MECLIZINE HCL 12.5 MG PO TABS: 12.5000 mg | ORAL_TABLET | Freq: Three times a day (TID) | ORAL | 0 refills | Status: AC | PRN

## 2018-03-21 NOTE — Progress Notes (Signed)
CSW received consult for grief resources. CSW met with patient who reports his wife passed away a few months ago from a stroke which he witness first hand. Patient reports going to [place] and [employer] finding Grief resources. CSW encouraged patient to continue education on grief. Patient reports coping with his grief by maintaining his wife's plants around the house, and continuing her ceramic rooster collection. Patient reports having extensive family, community, and Church support he is thankful for. CSW provided handouts on grief counseling resources and grief education that patient was accepting of.   Please notify for any future needs.   [place], [place]

## 2018-03-21 NOTE — Progress Notes (Signed)
PT here worked with pt then walked him with assist , pt did well , now up in chair

## 2018-03-21 NOTE — Progress Notes (Signed)
Pt discharged home via POV with family members in stable condition. Pt left with walker and shower chair

## 2018-03-21 NOTE — Progress Notes (Signed)
Tech went to stand pt to do ortho statitcs , , pt states is dizzy and kept closing eyes states room is spinning, falling forward , Dr Robb Matarrtiz made aware. PT called and person to do epley will not be here till 10 and will be made aware

## 2018-03-21 NOTE — Evaluation (Signed)
Occupational Therapy Evaluation Patient Details Name: Timothy BottomSamuel L Salser MRN: [id number] DOB: Mar 04, 1946 Today's Date: 03/21/2018    History of Present Illness pt is a 72 y/o male with h/o anxiety and HTN admitted with N/V and spinning type vertigo.  Imaging negative.   Clinical Impression   Patient evaluated by Occupational Therapy with no further acute OT needs identified. All education has been completed and the patient has no further questions. Pt is able to perform ADLs with min guard to supervision level when instituting gaze fixation strategies.   Recommend use of shower seat or sponge bathe at discharge.  He verbalizes understanding.  Recommend grief counseling or grief support group as he seems to be having a difficult time coping with loss of his wife.  See below for any follow-up Occupational Therapy or equipment needs. OT is signing off. Thank you for this referral.      Follow Up Recommendations  Home health OT;Supervision/Assistance - 24 hour    Equipment Recommendations  Tub/shower seat    Recommendations for Other Services       Precautions / Restrictions Precautions Precautions: Fall      Mobility Bed Mobility Overal bed mobility: Modified Independent                Transfers Overall transfer level: Modified independent               General transfer comment: Pt guarded with movement.  He moves very slowly     Balance Overall balance assessment: Needs assistance Sitting-balance support: No upper extremity supported Sitting balance-Leahy Scale: Good Sitting balance - Comments: moves slowly, but able to retrieve shoes from floor    Standing balance support: No upper extremity supported Standing balance-Leahy Scale: Fair                             ADL either performed or assessed with clinical judgement   ADL Overall ADL's : Needs assistance/impaired Eating/Feeding: Independent   Grooming: Wash/dry hands;Wash/dry face;Oral  care;Brushing hair;Supervision/safety;Standing   Upper Body Bathing: Set up;Sitting   Lower Body Bathing: Supervison/ safety;Sit to/from stand   Upper Body Dressing : Set up;Sitting   Lower Body Dressing: Supervision/safety;Sit to/from stand   Toilet Transfer: Supervision/safety;Ambulation;Comfort height toilet;RW   Toileting- Clothing Manipulation and Hygiene: Supervision/safety;Sit to/from stand   Tub/ Shower Transfer: Tub transfer;Min guard;Ambulation;Shower Dealerseat;Rolling walker Tub/Shower Transfer Details (indicate cue type and reason): discussed need to use shower seat vs sponge bathing  Functional mobility during ADLs: Supervision/safety;Rolling walker General ADL Comments: Pt instructed in gaze fixation during ADL tasks and was able to institute this strategy independently to reduce impact of vertigo      Vision Baseline Vision/History: Wears glasses Wears Glasses: At all times Patient Visual Report: No change from baseline       Perception     Praxis      Pertinent Vitals/Pain Pain Assessment: No/denies pain     Hand Dominance Right   Extremity/Trunk Assessment Upper Extremity Assessment Upper Extremity Assessment: Overall WFL for tasks assessed   Lower Extremity Assessment Lower Extremity Assessment: Overall WFL for tasks assessed       Communication Communication Communication: No difficulties   Cognition Arousal/Alertness: Awake/alert Behavior During Therapy: WFL for tasks assessed/performed Overall Cognitive Status: Within Functional Limits for tasks assessed  General Comments  Pt reports he will ask son to stay with him at discharge.  He speaks frequently about his deceased wife and reports difficulty coping with his loss.  Encouraged him to seek out grief counseling and/or grief support groups     Exercises     Shoulder Instructions      Home Living Family/patient expects to be discharged to::  Private residence Living Arrangements: Alone Available Help at Discharge: Available PRN/intermittently;Family Type of Home: House       Home Layout: One level     Bathroom Shower/Tub: Chief Strategy Officer: Standard     Home Equipment: None   Additional Comments: Pt recently lost his wife to a CVA and has been mourning and had difficutly coping with her loss.       Prior Functioning/Environment Level of Independence: Independent        Comments: Pt drives.          OT Problem List: Decreased activity tolerance;Impaired balance (sitting and/or standing);Decreased knowledge of use of DME or AE      OT Treatment/Interventions:      OT Goals(Current goals can be found in the care plan section) Acute Rehab OT Goals Patient Stated Goal: to feel better  OT Goal Formulation: All assessment and education complete, DC therapy  OT Frequency:     Barriers to D/C:            Co-evaluation              AM-PAC OT "6 Clicks" Daily Activity     Outcome Measure Help from another person eating meals?: None Help from another person taking care of personal grooming?: A Little Help from another person toileting, which includes using toliet, bedpan, or urinal?: A Little Help from another person bathing (including washing, rinsing, drying)?: A Little Help from another person to put on and taking off regular upper body clothing?: None Help from another person to put on and taking off regular lower body clothing?: A Little 6 Click Score: 20   End of Session Equipment Utilized During Treatment: Rolling walker;Gait belt Nurse Communication: Mobility status  Activity Tolerance: Patient tolerated treatment well Patient left: in bed;with call bell/phone within reach  OT Visit Diagnosis: Unsteadiness on feet (R26.81)                Time: 1610-9604 OT Time Calculation (min): 30 min Charges:  OT General Charges $OT Visit: 1 Visit OT Evaluation $OT Eval Low  Complexity: 1 Low OT Treatments $Self Care/Home Management : 8-22 mins  Jeani Hawking, OTR/L Acute Rehabilitation Services Pager [phone] Office [phone]   Jeani Hawking M 03/21/2018, 3:28 PM

## 2018-03-21 NOTE — Progress Notes (Signed)
PT here to do consult

## 2018-03-21 NOTE — Care Management Note (Signed)
Case Management Note  Patient Details  Name: Timothy BottomSamuel L Kisiel MRN: [id number] Date of Birth: 1946-04-17  Subjective/Objective: Pt presented for vertigo. PTA from home. Patient having issues with transportation. [facility]                   Action/Plan: Spoke with [facility] with [facility]. SOC to begin within 24-48 hours post transition home. DME RW delivered to home. No further needs from CM at this time.   Expected Discharge Date:  03/21/18               Expected Discharge Plan:  Home w Home Health Services  In-House Referral:  NA  Discharge planning Services  CM Consult  Post Acute Care Choice:  Durable Medical Equipment, Home Health Choice offered to:  Patient  DME Arranged:  Walker rolling DME Agency:  [facility]  [facility] Arranged[facility]  [facility]  Status of Service:  Completed, signed off  If discussed at Long Length of Stay Meetings, dates discussed:    Additional Comments:  Gala LewandowskyGraves-Bigelow, Bana Borgmeyer Kaye, RN 03/21/2018, 1:21 PM

## 2018-03-21 NOTE — Discharge Summary (Signed)
Physician Discharge Summary  Eliezer BottomSamuel L Loomis ZOX:[id number] DOB: 31-Mar-1946 DOA: 03/20/2018  PCP: Blair HeysEhinger, Robert, MD  Admit date: 03/20/2018 Discharge date: 03/21/2018  Admitted From: Home Disposition:  Home  Recommendations for Outpatient Follow-up:  1. Follow up with PCP in 1-2 weeks 2. OT to follow at home  Home Health:Yes Equipment/Devices:none  Discharge Condition:stable CODE STATUS:full Diet recommendation: Heart Healthy   Brief/Interim Summary: 72 y.o. male PMH of anxiety, HTN, nausea vomiting and dizziness  Discharge Diagnoses:  Active Problems:   Vertigo   Prolonged QT interval   Dehydration   Nausea and vomiting He describes clearly vertigonous symptom. The room is spinning around him. No light headedness, hearing loss or viral symptoms recently. No focal weakness on Physical exam, MRI showed no CVA. Started on anti-vert and symptom improved. Prolong Qtc resolved. K is 4.1 and MAg is normal.    Discharge Instructions  Discharge Instructions    Diet - low sodium heart healthy   Complete by:  As directed    Increase activity slowly   Complete by:  As directed      Allergies as of 03/21/2018   Not on File     Medication List    TAKE these medications   albuterol 108 (90 Base) MCG/ACT inhaler Commonly known as:  PROVENTIL HFA;VENTOLIN HFA Inhale 2 puffs into the lungs every 6 (six) hours as needed for wheezing or shortness of breath.   budesonide-formoterol 160-4.5 MCG/ACT inhaler Commonly known as:  SYMBICORT Inhale 2 puffs into the lungs 2 (two) times daily.   escitalopram 10 MG tablet Commonly known as:  LEXAPRO Take 10 mg by mouth daily.   hydrochlorothiazide 25 MG tablet Commonly known as:  HYDRODIURIL Take 25 mg by mouth daily.   losartan 100 MG tablet Commonly known as:  COZAAR Take 100 mg by mouth daily.   meclizine 12.5 MG tablet Commonly known as:  ANTIVERT Take 1 tablet (12.5 mg total) by mouth 3 (three) times daily as  needed for dizziness.       Not on File  Consultations:  None   Procedures/Studies: Mr Brain Wo Contrast  Result Date: 03/20/2018 CLINICAL DATA:  72 year old male with extreme dizziness and nausea since 0800 hours today. EXAM: MRI HEAD WITHOUT CONTRAST TECHNIQUE: Multiplanar, multiecho pulse sequences of the brain and surrounding structures were obtained without intravenous contrast. COMPARISON:  None. FINDINGS: Brain: No restricted diffusion to suggest acute infarction. No midline shift, mass effect, evidence of mass lesion, ventriculomegaly, extra-axial collection or acute intracranial hemorrhage. Cervicomedullary junction and pituitary are within normal limits. Mild to moderate for age scattered cerebral white matter T2 and FLAIR hyperintensity, mostly small but occasionally patchy foci. No cortical encephalomalacia or chronic cerebral blood products identified. Deep gray matter nuclei, the brainstem, and cerebellum appear normal. Vascular: Major intracranial vascular flow voids are preserved. Mild intracranial artery tortuosity. Skull and upper cervical spine: Negative visible cervical spine. Normal bone marrow signal. Sinuses/Orbits: Negative orbits. Trace paranasal sinus mucosal thickening. Other: Visible internal auditory structures appear normal. Mastoids are clear. Normal stylomastoid foramina. Scalp and face soft tissues appear negative. IMPRESSION: No acute intracranial abnormality and largely unremarkable for age noncontrast MRI appearance of the brain. Mild nonspecific cerebral white matter signal changes, most commonly due to chronic small vessel disease. Electronically Signed   By: Odessa FlemingH  Hall M.D.   On: 03/20/2018 17:26      Subjective: No complains, he relates his symptoms are bteer.  Discharge Exam: Vitals:   03/20/18 2336 03/21/18 0526  BP:  128/63 137/75  Pulse: 74 70  Resp: 18 16  Temp: 98.1 F (36.7 C) 97.9 F (36.6 C)  SpO2: 98% 96%   Vitals:   03/20/18 2018  03/20/18 2041 03/20/18 2336 03/21/18 0526  BP: (!) 145/74 (!) 153/77 128/63 137/75  Pulse: 62 78 74 70  Resp: 18 18 18 16   Temp:  (!) 97.3 F (36.3 C) 98.1 F (36.7 C) 97.9 F (36.6 C)  TempSrc:  Oral Oral Oral  SpO2: 96% 95% 98% 96%  Weight:      Height:        General: Pt is alert, awake, not in acute distress Cardiovascular: RRR, S1/S2 +, no rubs, no gallops Respiratory: CTA bilaterally, no wheezing, no rhonchi Abdominal: Soft, NT, ND, bowel sounds + Extremities: no edema, no cyanosis    The results of significant diagnostics from this hospitalization (including imaging, microbiology, ancillary and laboratory) are listed below for reference.     Microbiology: No results found for this or any previous visit (from the past 240 hour(s)).   Labs: BNP (last 3 results) No results for input(s): BNP in the last 8760 hours. Basic Metabolic Panel: Recent Labs  Lab 03/20/18 1422  NA 134*  K 4.1  CL 98  CO2 27  GLUCOSE 152*  BUN 16  CREATININE 0.87  CALCIUM 8.7*  MG 2.0   Liver Function Tests: Recent Labs  Lab 03/20/18 1422  AST 19  ALT 16  ALKPHOS 61  BILITOT 0.6  PROT 6.6  ALBUMIN 3.5   No results for input(s): LIPASE, AMYLASE in the last 168 hours. No results for input(s): AMMONIA in the last 168 hours. CBC: Recent Labs  Lab 03/20/18 1422 03/21/18 0349  WBC 9.6 12.7*  NEUTROABS 7.6  --   HGB 14.1 14.3  HCT 43.9 43.9  MCV 90.5 89.4  PLT 295 277   Cardiac Enzymes: No results for input(s): CKTOTAL, CKMB, CKMBINDEX, TROPONINI in the last 168 hours. BNP: Invalid input(s): POCBNP CBG: No results for input(s): GLUCAP in the last 168 hours. D-Dimer No results for input(s): DDIMER in the last 72 hours. Hgb A1c No results for input(s): HGBA1C in the last 72 hours. Lipid Profile No results for input(s): CHOL, HDL, LDLCALC, TRIG, CHOLHDL, LDLDIRECT in the last 72 hours. Thyroid function studies Recent Labs    03/21/18 0349  TSH 5.491*   Anemia  work up No results for input(s): VITAMINB12, FOLATE, FERRITIN, TIBC, IRON, RETICCTPCT in the last 72 hours. Urinalysis No results found for: COLORURINE, APPEARANCEUR, LABSPEC, PHURINE, GLUCOSEU, HGBUR, BILIRUBINUR, KETONESUR, PROTEINUR, UROBILINOGEN, NITRITE, LEUKOCYTESUR Sepsis Labs Invalid input(s): PROCALCITONIN,  WBC,  LACTICIDVEN Microbiology No results found for this or any previous visit (from the past 240 hour(s)).   Time coordinating discharge: 40 minutes  SIGNED:   Marinda Elk, MD  [facility] 03/21/2018, 8:25 AM Pager   If 7PM-7AM, please contact night-coverage [email or web address] Password [email or web address]

## 2018-03-21 NOTE — Progress Notes (Addendum)
Physical Therapy Treatment Patient Details Name: MAYKEL REITTER MRN: [id number] DOB: [birth year] Today's Date: 03/21/2018    History of Present Illness pt is a 72 y/o male with h/o anxiety and HTN admitted with N/V and spinning type vertigo.  Imaging negative.    PT Comments    Basic occulomotor and vestibular testing complete with no profound signs of BPPV.  L post BPPV showed mild/limited nystagmus, which was treated with Epply, but pt as is typical still feels the dysequilibrium and therefore results are underwhelming.    Follow Up Recommendations  Outpatient PT;Other (comment)(further vestibular assessment--HHPT if pt not able to get to OPPT)     Equipment Recommendations  None recommended by PT    Recommendations for Other Services       Precautions / Restrictions Precautions Precautions: Fall    Mobility  Bed Mobility Overal bed mobility: Modified Independent                Transfers Overall transfer level: Modified independent               General transfer comment: pt with guarded movement due to lightheadedness where pt stated more spinning on admission  Ambulation/Gait Ambulation/Gait assistance: Min guard(holding to IV pole) Gait Distance (Feet): 150 Feet Assistive device: IV Pole Gait Pattern/deviations: Step-through pattern Gait velocity: slower and tentative Gait velocity interpretation: <1.8 ft/sec, indicate of risk for recurrent falls General Gait Details: Tentative gait with several episodes of deviation or stability checks due to lightheaded type dizziness.   Stairs             Wheelchair Mobility    Modified Rankin (Stroke Patients Only)       Balance Overall balance assessment: Needs assistance Sitting-balance support: No upper extremity supported Sitting balance-Leahy Scale: Fair       Standing balance-Leahy Scale: Fair Standing balance comment: more comfortable holding to stationary objects                             Cognition Arousal/Alertness: Awake/alert Behavior During Therapy: WFL for tasks assessed/performed Overall Cognitive Status: Within Functional Limits for tasks assessed                                        Exercises      General Comments General comments (skin integrity, edema, etc.): Vestibular assessment-- Saccades/pursuits normal, VOR, head thrust negative, but these made pt queazy.  Overall, BPPV testing produced no profound results.  There was suspect geotrophic nystagmus with supine head roll to the left and minimal torsional nystagmus to the left with [employer] including c/o some passing double vision.  No nystagmus seen from the right side testing, but pt felt sensation of sysequalibrium to the right.  Epply was completed for suspected L Post BPPV.  R Post BPPV treated on the off chance that there was an issue.  Pt's equilibrium is still off at end of the evaluation which is oftern typical.  He hopes to not discharge today.      Pertinent Vitals/Pain Pain Assessment: No/denies pain    Home Living Family/patient expects to be discharged to:: Private residence Living Arrangements: Alone Available Help at Discharge: Available PRN/intermittently;Family Type of Home: House     Home Layout: One level        Prior Function Level of Independence: Independent  PT Goals (current goals can now be found in the care plan section) Acute Rehab PT Goals Patient Stated Goal: back to normal, not go home unless Im ready PT Goal Formulation: With patient Time For Goal Achievement: 03/28/18 Potential to Achieve Goals: Good Additional Goals Additional Goal #1: Reassess for post/horizontal BPPV and treat as necessary to attain 3/10 internsity of vertigo.    Frequency    Min 3X/week      PT Plan      Co-evaluation              AM-PAC PT "6 Clicks" Mobility   Outcome Measure  Help needed turning from your back to your  side while in a flat bed without using bedrails?: None Help needed moving from lying on your back to sitting on the side of a flat bed without using bedrails?: None Help needed moving to and from a bed to a chair (including a wheelchair)?: None Help needed standing up from a chair using your arms (e.g., wheelchair or bedside chair)?: None Help needed to walk in hospital room?: A Little Help needed climbing 3-5 steps with a railing? : A Little 6 Click Score: 22    End of Session   Activity Tolerance: Patient tolerated treatment well     PT Visit Diagnosis: Other symptoms and signs involving the nervous system (R29.898);BPPV;Dizziness and giddiness (R42) BPPV - Right/Left : Left     Time: [id number] PT Time Calculation (min) (ACUTE ONLY): 54 min  Charges:  $Gait Training: 8-22 mins $Neuromuscular Re-education: 8-22 mins                     03/21/2018  Buckland BingKen Kais Monje, PT Acute Rehabilitation Services [phone]  (pager) [phone]  (office)   Eliseo GumKenneth V Navjot Pilgrim 03/21/2018, 2:37 PM

## 2018-04-11 DIAGNOSIS — I10 Essential (primary) hypertension: Secondary | ICD-10-CM | POA: Diagnosis not present

## 2018-04-11 DIAGNOSIS — F411 Generalized anxiety disorder: Secondary | ICD-10-CM | POA: Diagnosis not present

## 2018-04-11 DIAGNOSIS — N400 Enlarged prostate without lower urinary tract symptoms: Secondary | ICD-10-CM | POA: Diagnosis not present

## 2018-04-11 DIAGNOSIS — E7800 Pure hypercholesterolemia, unspecified: Secondary | ICD-10-CM | POA: Diagnosis not present

## 2018-04-19 ENCOUNTER — Other Ambulatory Visit: Payer: Self-pay | Admitting: Pharmacist

## 2018-04-19 NOTE — Patient Outreach (Signed)
[facility] Care Management  04/19/2018  Eliezer BottomSamuel L Altidor April 08, 1946 [id number]   Call to follow up with patient's PCP office to request that patient's levothyroxine prescription be called into his local pharmacy. Leave a message with Okey RegalCarol in the office  Receive a call back from [place] in the office who states that they will call the prescription into the patient's local pharmacy for him now.  Duanne MoronElisabeth Rony Ratz, PharmD, [facility] Clinical Pharmacist [facility] Care Management [phone]

## 2018-04-19 NOTE — Patient Outreach (Signed)
[facility] Care Management  04/19/2018  Timothy BottomSamuel L Johnson 1946-02-16 [id number]   Incoming call from Timothy Johnson in response to the [facility] Med[facility] Adherence Campaign. Speak with patient. HIPAA identifiers verified and verbal consent received.  Timothy Johnson reports that he takes his losartan 50 mg once daily as directed. Denies any missed doses or barriers to adherence. Counsel patient on the importance of medication adherence. Patient reports that [facility]  Patient reports that his doctor has instructed him to start taking levothyroxine and follow up for lab work in 8 weeks. Counsel patient about the importance of the follow up for labwork to see if his dose needs any adjustment. Reports that his PCP's nurse was going to get this prescription [facility] to [facility] so that [facility]. However, patient reports that he is having difficulty with getting the prescription this way and wonders if it would be easier and inexpensive enough to just get it from his local pharmacy. Note that per the [facility] website, levothyroxine is a tier 2 option through his plan, costing $6/month through the patient's plan.  Patient reports that he would like to get this prescription locally. Let patient know that I will call his PCP office to request that the prescription be called to his local pharmacy.  Timothy Johnson denies any further medication questions/concerns at this time. Provide patient with my phone number.   Will close pharmacy episode after reaching out to patient's PCP office.  Timothy Johnson, PharmD, [facility]ACP Clinical Pharmacist [facility] Care Management [phone]

## 2018-04-22 ENCOUNTER — Other Ambulatory Visit: Payer: Self-pay | Admitting: Pharmacist

## 2018-04-22 NOTE — Patient Outreach (Signed)
[facility] Care Management  04/22/2018  Timothy Johnson 1945/10/04 [id number]    Call to follow up with Mr. [name]. HIPAA identifiers verified.  Patient confirms that he picked up his levothyroxine from [facility]. Reports that he paid $10 for a 30 day supply. Counsel patient about his Medicare Part D plan preferred pharmacies. Also counsel patient about [facility]. Patient verbalizes understanding and expresses appreciation for my help.  Patient reports that he recently received a bill from his Medicare plan indicating that a service from his hospitalization last month was not covered. Advise patient to call to follow up with the hospital billing department for further information. Confirm that patient has my phone number and ask that if he is unable to get the answers that he needs from the billing department to call me back for further assistance.  PLAN  Will close pharmacy episode at this time.  Duanne MoronElisabeth Thurley Francesconi, PharmD, [facility]ACP Clinical Pharmacist [facility] Care Management [phone]

## 2018-06-07 DIAGNOSIS — E039 Hypothyroidism, unspecified: Secondary | ICD-10-CM | POA: Diagnosis not present

## 2018-10-30 DIAGNOSIS — E039 Hypothyroidism, unspecified: Secondary | ICD-10-CM | POA: Diagnosis not present

## 2018-10-30 DIAGNOSIS — Z0000 Encounter for general adult medical examination without abnormal findings: Secondary | ICD-10-CM | POA: Diagnosis not present

## 2018-10-30 DIAGNOSIS — I10 Essential (primary) hypertension: Secondary | ICD-10-CM | POA: Diagnosis not present

## 2018-10-30 DIAGNOSIS — E7800 Pure hypercholesterolemia, unspecified: Secondary | ICD-10-CM | POA: Diagnosis not present

## 2018-11-27 DIAGNOSIS — F411 Generalized anxiety disorder: Secondary | ICD-10-CM | POA: Diagnosis not present

## 2019-02-27 DIAGNOSIS — I10 Essential (primary) hypertension: Secondary | ICD-10-CM | POA: Diagnosis not present

## 2019-02-27 DIAGNOSIS — F411 Generalized anxiety disorder: Secondary | ICD-10-CM | POA: Diagnosis not present

## 2019-02-27 DIAGNOSIS — E039 Hypothyroidism, unspecified: Secondary | ICD-10-CM | POA: Diagnosis not present

## 2019-05-30 DIAGNOSIS — E039 Hypothyroidism, unspecified: Secondary | ICD-10-CM | POA: Diagnosis not present

## 2019-05-30 DIAGNOSIS — F411 Generalized anxiety disorder: Secondary | ICD-10-CM | POA: Diagnosis not present

## 2019-05-30 DIAGNOSIS — J45909 Unspecified asthma, uncomplicated: Secondary | ICD-10-CM | POA: Diagnosis not present

## 2019-05-30 DIAGNOSIS — I10 Essential (primary) hypertension: Secondary | ICD-10-CM | POA: Diagnosis not present

## 2019-06-04 DIAGNOSIS — E039 Hypothyroidism, unspecified: Secondary | ICD-10-CM | POA: Diagnosis not present

## 2019-06-04 DIAGNOSIS — I10 Essential (primary) hypertension: Secondary | ICD-10-CM | POA: Diagnosis not present

## 2019-06-27 ENCOUNTER — Ambulatory Visit: Payer: Medicare Other | Attending: Internal Medicine

## 2019-06-27 DIAGNOSIS — Z23 Encounter for immunization: Secondary | ICD-10-CM | POA: Insufficient documentation

## 2019-06-27 NOTE — Progress Notes (Signed)
   Covid-19 Vaccination Clinic  Name:  Timothy Johnson    MRN: [id number] DOB: Dec 26, 1945  06/27/2019  Timothy Johnson was observed post Covid-19 immunization for 15 minutes without incident. He was provided with Vaccine Information Sheet and instruction to access the V-Safe system.   Timothy Johnson was instructed to call 911 with any severe reactions post vaccine: Marland Kitchen Difficulty breathing  . Swelling of face and throat  . A fast heartbeat  . A bad rash all over body  . Dizziness and weakness   Immunizations Administered    Name Date Dose VIS Date Route   Pfizer COVID-19 Vaccine 06/27/2019  2:43 AM 0.3 mL 04/04/2019 Intramuscular   Manufacturer: [employer], [employer]   Lot: [id number]   NDC: [address]-0

## 2019-07-29 ENCOUNTER — Ambulatory Visit: Payer: Medicare Other | Attending: Internal Medicine

## 2019-07-29 DIAGNOSIS — Z23 Encounter for immunization: Secondary | ICD-10-CM

## 2019-07-29 NOTE — Progress Notes (Signed)
   Covid-19 Vaccination Clinic  Name:  Timothy Johnson    MRN: [id number] DOB: February 12, 1946  07/29/2019  Timothy Johnson was observed post Covid-19 immunization for 15 minutes without incident. He was provided with Vaccine Information Sheet and instruction to access the V-Safe system.   Timothy Johnson was instructed to call 911 with any severe reactions post vaccine: Marland Kitchen Difficulty breathing  . Swelling of face and throat  . A fast heartbeat  . A bad rash all over body  . Dizziness and weakness   Immunizations Administered    Name Date Dose VIS Date Route   Pfizer COVID-19 Vaccine 07/29/2019  9:28 AM 0.3 mL 04/04/2019 Intramuscular   Manufacturer: [employer], [employer]   Lot: [id number]   NDC: [address]-0

## 2019-09-03 DIAGNOSIS — Z1159 Encounter for screening for other viral diseases: Secondary | ICD-10-CM | POA: Diagnosis not present

## 2019-10-23 DIAGNOSIS — Z1159 Encounter for screening for other viral diseases: Secondary | ICD-10-CM | POA: Diagnosis not present

## 2019-10-28 DIAGNOSIS — Z1211 Encounter for screening for malignant neoplasm of colon: Secondary | ICD-10-CM | POA: Diagnosis not present

## 2019-10-28 DIAGNOSIS — D120 Benign neoplasm of cecum: Secondary | ICD-10-CM | POA: Diagnosis not present

## 2019-10-28 DIAGNOSIS — K5730 Diverticulosis of large intestine without perforation or abscess without bleeding: Secondary | ICD-10-CM | POA: Diagnosis not present

## 2019-10-28 DIAGNOSIS — Z800 Family history of malignant neoplasm of digestive organs: Secondary | ICD-10-CM | POA: Diagnosis not present

## 2019-10-31 DIAGNOSIS — D120 Benign neoplasm of cecum: Secondary | ICD-10-CM | POA: Diagnosis not present

## 2019-11-04 DIAGNOSIS — Z0000 Encounter for general adult medical examination without abnormal findings: Secondary | ICD-10-CM | POA: Diagnosis not present

## 2019-11-04 DIAGNOSIS — E039 Hypothyroidism, unspecified: Secondary | ICD-10-CM | POA: Diagnosis not present

## 2019-11-04 DIAGNOSIS — E7800 Pure hypercholesterolemia, unspecified: Secondary | ICD-10-CM | POA: Diagnosis not present

## 2019-11-04 DIAGNOSIS — I10 Essential (primary) hypertension: Secondary | ICD-10-CM | POA: Diagnosis not present

## 2020-03-22 DIAGNOSIS — Z125 Encounter for screening for malignant neoplasm of prostate: Secondary | ICD-10-CM | POA: Diagnosis not present

## 2020-03-22 DIAGNOSIS — C6211 Malignant neoplasm of descended right testis: Secondary | ICD-10-CM | POA: Diagnosis not present

## 2020-03-22 DIAGNOSIS — R3915 Urgency of urination: Secondary | ICD-10-CM | POA: Diagnosis not present

## 2020-05-13 DIAGNOSIS — I10 Essential (primary) hypertension: Secondary | ICD-10-CM | POA: Diagnosis not present

## 2020-05-13 DIAGNOSIS — E039 Hypothyroidism, unspecified: Secondary | ICD-10-CM | POA: Diagnosis not present

## 2020-05-13 DIAGNOSIS — F411 Generalized anxiety disorder: Secondary | ICD-10-CM | POA: Diagnosis not present

## 2020-05-13 DIAGNOSIS — E7800 Pure hypercholesterolemia, unspecified: Secondary | ICD-10-CM | POA: Diagnosis not present

## 2020-06-17 DIAGNOSIS — I10 Essential (primary) hypertension: Secondary | ICD-10-CM | POA: Diagnosis not present

## 2020-07-08 DIAGNOSIS — I10 Essential (primary) hypertension: Secondary | ICD-10-CM | POA: Diagnosis not present

## 2020-08-26 DIAGNOSIS — N529 Male erectile dysfunction, unspecified: Secondary | ICD-10-CM | POA: Diagnosis not present

## 2020-09-23 DIAGNOSIS — L821 Other seborrheic keratosis: Secondary | ICD-10-CM | POA: Diagnosis not present

## 2020-09-23 DIAGNOSIS — L570 Actinic keratosis: Secondary | ICD-10-CM | POA: Diagnosis not present

## 2020-12-29 DIAGNOSIS — Z23 Encounter for immunization: Secondary | ICD-10-CM | POA: Diagnosis not present

## 2020-12-29 DIAGNOSIS — F5232 Male orgasmic disorder: Secondary | ICD-10-CM | POA: Diagnosis not present

## 2020-12-29 DIAGNOSIS — F411 Generalized anxiety disorder: Secondary | ICD-10-CM | POA: Diagnosis not present

## 2021-03-01 DIAGNOSIS — L308 Other specified dermatitis: Secondary | ICD-10-CM | POA: Diagnosis not present

## 2021-03-01 DIAGNOSIS — L0100 Impetigo, unspecified: Secondary | ICD-10-CM | POA: Diagnosis not present

## 2021-03-16 DIAGNOSIS — F411 Generalized anxiety disorder: Secondary | ICD-10-CM | POA: Diagnosis not present

## 2021-03-16 DIAGNOSIS — I10 Essential (primary) hypertension: Secondary | ICD-10-CM | POA: Diagnosis not present

## 2021-04-13 DIAGNOSIS — F411 Generalized anxiety disorder: Secondary | ICD-10-CM | POA: Diagnosis not present

## 2021-04-13 DIAGNOSIS — I10 Essential (primary) hypertension: Secondary | ICD-10-CM | POA: Diagnosis not present

## 2021-05-20 DIAGNOSIS — E039 Hypothyroidism, unspecified: Secondary | ICD-10-CM | POA: Diagnosis not present

## 2021-05-20 DIAGNOSIS — E7800 Pure hypercholesterolemia, unspecified: Secondary | ICD-10-CM | POA: Diagnosis not present

## 2021-05-20 DIAGNOSIS — F39 Unspecified mood [affective] disorder: Secondary | ICD-10-CM | POA: Diagnosis not present

## 2021-05-20 DIAGNOSIS — F411 Generalized anxiety disorder: Secondary | ICD-10-CM | POA: Diagnosis not present

## 2021-05-20 DIAGNOSIS — Z0000 Encounter for general adult medical examination without abnormal findings: Secondary | ICD-10-CM | POA: Diagnosis not present

## 2021-05-20 DIAGNOSIS — I10 Essential (primary) hypertension: Secondary | ICD-10-CM | POA: Diagnosis not present

## 2021-07-19 DIAGNOSIS — L308 Other specified dermatitis: Secondary | ICD-10-CM | POA: Diagnosis not present

## 2021-07-19 DIAGNOSIS — I872 Venous insufficiency (chronic) (peripheral): Secondary | ICD-10-CM | POA: Diagnosis not present

## 2021-11-17 DIAGNOSIS — E039 Hypothyroidism, unspecified: Secondary | ICD-10-CM | POA: Diagnosis not present

## 2021-11-17 DIAGNOSIS — I10 Essential (primary) hypertension: Secondary | ICD-10-CM | POA: Diagnosis not present

## 2021-11-17 DIAGNOSIS — F411 Generalized anxiety disorder: Secondary | ICD-10-CM | POA: Diagnosis not present

## 2021-11-17 DIAGNOSIS — E7800 Pure hypercholesterolemia, unspecified: Secondary | ICD-10-CM | POA: Diagnosis not present

## 2021-11-17 DIAGNOSIS — F39 Unspecified mood [affective] disorder: Secondary | ICD-10-CM | POA: Diagnosis not present

## 2022-04-25 DIAGNOSIS — K080 Exfoliation of teeth due to systemic causes: Secondary | ICD-10-CM | POA: Diagnosis not present

## 2022-05-23 DIAGNOSIS — Z1331 Encounter for screening for depression: Secondary | ICD-10-CM | POA: Diagnosis not present

## 2022-05-23 DIAGNOSIS — E039 Hypothyroidism, unspecified: Secondary | ICD-10-CM | POA: Diagnosis not present

## 2022-05-23 DIAGNOSIS — E7800 Pure hypercholesterolemia, unspecified: Secondary | ICD-10-CM | POA: Diagnosis not present

## 2022-05-23 DIAGNOSIS — I10 Essential (primary) hypertension: Secondary | ICD-10-CM | POA: Diagnosis not present

## 2022-05-23 DIAGNOSIS — F411 Generalized anxiety disorder: Secondary | ICD-10-CM | POA: Diagnosis not present

## 2022-05-23 DIAGNOSIS — Z0000 Encounter for general adult medical examination without abnormal findings: Secondary | ICD-10-CM | POA: Diagnosis not present

## 2022-05-23 DIAGNOSIS — F39 Unspecified mood [affective] disorder: Secondary | ICD-10-CM | POA: Diagnosis not present

## 2022-05-31 DIAGNOSIS — K080 Exfoliation of teeth due to systemic causes: Secondary | ICD-10-CM | POA: Diagnosis not present

## 2022-06-14 DIAGNOSIS — K080 Exfoliation of teeth due to systemic causes: Secondary | ICD-10-CM | POA: Diagnosis not present

## 2022-10-31 DIAGNOSIS — K080 Exfoliation of teeth due to systemic causes: Secondary | ICD-10-CM | POA: Diagnosis not present

## 2022-11-21 DIAGNOSIS — F39 Unspecified mood [affective] disorder: Secondary | ICD-10-CM | POA: Diagnosis not present

## 2022-11-21 DIAGNOSIS — E7800 Pure hypercholesterolemia, unspecified: Secondary | ICD-10-CM | POA: Diagnosis not present

## 2022-11-21 DIAGNOSIS — E039 Hypothyroidism, unspecified: Secondary | ICD-10-CM | POA: Diagnosis not present

## 2022-11-21 DIAGNOSIS — F411 Generalized anxiety disorder: Secondary | ICD-10-CM | POA: Diagnosis not present

## 2022-11-21 DIAGNOSIS — I10 Essential (primary) hypertension: Secondary | ICD-10-CM | POA: Diagnosis not present

## 2022-12-04 DIAGNOSIS — F3342 Major depressive disorder, recurrent, in full remission: Secondary | ICD-10-CM | POA: Diagnosis not present

## 2023-01-24 DIAGNOSIS — L308 Other specified dermatitis: Secondary | ICD-10-CM | POA: Diagnosis not present

## 2023-02-14 DIAGNOSIS — K080 Exfoliation of teeth due to systemic causes: Secondary | ICD-10-CM | POA: Diagnosis not present

## 2023-05-15 DIAGNOSIS — H524 Presbyopia: Secondary | ICD-10-CM | POA: Diagnosis not present

## 2023-05-25 DIAGNOSIS — E7800 Pure hypercholesterolemia, unspecified: Secondary | ICD-10-CM | POA: Diagnosis not present

## 2023-05-25 DIAGNOSIS — Z23 Encounter for immunization: Secondary | ICD-10-CM | POA: Diagnosis not present

## 2023-05-25 DIAGNOSIS — E039 Hypothyroidism, unspecified: Secondary | ICD-10-CM | POA: Diagnosis not present

## 2023-05-25 DIAGNOSIS — Z1159 Encounter for screening for other viral diseases: Secondary | ICD-10-CM | POA: Diagnosis not present

## 2023-05-25 DIAGNOSIS — I10 Essential (primary) hypertension: Secondary | ICD-10-CM | POA: Diagnosis not present

## 2023-05-25 DIAGNOSIS — Z0000 Encounter for general adult medical examination without abnormal findings: Secondary | ICD-10-CM | POA: Diagnosis not present

## 2023-09-22 DIAGNOSIS — J45909 Unspecified asthma, uncomplicated: Secondary | ICD-10-CM | POA: Diagnosis not present

## 2023-09-22 DIAGNOSIS — F411 Generalized anxiety disorder: Secondary | ICD-10-CM | POA: Diagnosis not present

## 2023-09-22 DIAGNOSIS — E039 Hypothyroidism, unspecified: Secondary | ICD-10-CM | POA: Diagnosis not present

## 2023-09-24 DIAGNOSIS — I10 Essential (primary) hypertension: Secondary | ICD-10-CM | POA: Diagnosis not present

## 2023-10-22 DIAGNOSIS — F411 Generalized anxiety disorder: Secondary | ICD-10-CM | POA: Diagnosis not present

## 2023-10-22 DIAGNOSIS — I10 Essential (primary) hypertension: Secondary | ICD-10-CM | POA: Diagnosis not present

## 2023-10-22 DIAGNOSIS — J45909 Unspecified asthma, uncomplicated: Secondary | ICD-10-CM | POA: Diagnosis not present

## 2023-10-22 DIAGNOSIS — E039 Hypothyroidism, unspecified: Secondary | ICD-10-CM | POA: Diagnosis not present

## 2023-11-03 DIAGNOSIS — I10 Essential (primary) hypertension: Secondary | ICD-10-CM | POA: Diagnosis not present

## 2023-11-07 DIAGNOSIS — F411 Generalized anxiety disorder: Secondary | ICD-10-CM | POA: Diagnosis not present

## 2023-11-07 DIAGNOSIS — J45909 Unspecified asthma, uncomplicated: Secondary | ICD-10-CM | POA: Diagnosis not present

## 2023-11-22 DIAGNOSIS — J45909 Unspecified asthma, uncomplicated: Secondary | ICD-10-CM | POA: Diagnosis not present

## 2023-11-22 DIAGNOSIS — I10 Essential (primary) hypertension: Secondary | ICD-10-CM | POA: Diagnosis not present

## 2023-11-22 DIAGNOSIS — F411 Generalized anxiety disorder: Secondary | ICD-10-CM | POA: Diagnosis not present

## 2023-11-22 DIAGNOSIS — E7800 Pure hypercholesterolemia, unspecified: Secondary | ICD-10-CM | POA: Diagnosis not present

## 2023-11-22 DIAGNOSIS — E039 Hypothyroidism, unspecified: Secondary | ICD-10-CM | POA: Diagnosis not present

## 2023-12-03 DIAGNOSIS — I10 Essential (primary) hypertension: Secondary | ICD-10-CM | POA: Diagnosis not present

## 2023-12-23 DIAGNOSIS — E039 Hypothyroidism, unspecified: Secondary | ICD-10-CM | POA: Diagnosis not present

## 2023-12-23 DIAGNOSIS — I10 Essential (primary) hypertension: Secondary | ICD-10-CM | POA: Diagnosis not present

## 2023-12-23 DIAGNOSIS — J45909 Unspecified asthma, uncomplicated: Secondary | ICD-10-CM | POA: Diagnosis not present

## 2023-12-23 DIAGNOSIS — F411 Generalized anxiety disorder: Secondary | ICD-10-CM | POA: Diagnosis not present

## 2024-01-15 DIAGNOSIS — M545 Low back pain, unspecified: Secondary | ICD-10-CM | POA: Diagnosis not present

## 2024-01-15 DIAGNOSIS — Z23 Encounter for immunization: Secondary | ICD-10-CM | POA: Diagnosis not present

## 2024-01-15 DIAGNOSIS — I10 Essential (primary) hypertension: Secondary | ICD-10-CM | POA: Diagnosis not present

## 2024-01-15 DIAGNOSIS — J45909 Unspecified asthma, uncomplicated: Secondary | ICD-10-CM | POA: Diagnosis not present

## 2024-01-22 DIAGNOSIS — J45909 Unspecified asthma, uncomplicated: Secondary | ICD-10-CM | POA: Diagnosis not present

## 2024-01-22 DIAGNOSIS — I10 Essential (primary) hypertension: Secondary | ICD-10-CM | POA: Diagnosis not present

## 2024-01-22 DIAGNOSIS — E039 Hypothyroidism, unspecified: Secondary | ICD-10-CM | POA: Diagnosis not present

## 2024-01-22 DIAGNOSIS — F411 Generalized anxiety disorder: Secondary | ICD-10-CM | POA: Diagnosis not present

## 2024-01-23 DIAGNOSIS — E039 Hypothyroidism, unspecified: Secondary | ICD-10-CM | POA: Diagnosis not present

## 2024-01-30 DIAGNOSIS — I10 Essential (primary) hypertension: Secondary | ICD-10-CM | POA: Diagnosis not present

## 2024-02-22 DIAGNOSIS — F411 Generalized anxiety disorder: Secondary | ICD-10-CM | POA: Diagnosis not present

## 2024-02-22 DIAGNOSIS — J45909 Unspecified asthma, uncomplicated: Secondary | ICD-10-CM | POA: Diagnosis not present

## 2024-02-22 DIAGNOSIS — I10 Essential (primary) hypertension: Secondary | ICD-10-CM | POA: Diagnosis not present

## 2024-02-22 DIAGNOSIS — E039 Hypothyroidism, unspecified: Secondary | ICD-10-CM | POA: Diagnosis not present

## 2024-03-10 DIAGNOSIS — Z638 Other specified problems related to primary support group: Secondary | ICD-10-CM | POA: Diagnosis not present

## 2024-03-23 DIAGNOSIS — I10 Essential (primary) hypertension: Secondary | ICD-10-CM | POA: Diagnosis not present

## 2024-03-23 DIAGNOSIS — F411 Generalized anxiety disorder: Secondary | ICD-10-CM | POA: Diagnosis not present

## 2024-03-23 DIAGNOSIS — E039 Hypothyroidism, unspecified: Secondary | ICD-10-CM | POA: Diagnosis not present

## 2024-03-23 DIAGNOSIS — J45909 Unspecified asthma, uncomplicated: Secondary | ICD-10-CM | POA: Diagnosis not present
